# Patient Record
Sex: Female | Born: 1940 | Race: White | Hispanic: No | Smoking: Never smoker
Health system: Southern US, Community
[De-identification: ages and names within clinical notes are randomized; demographics above are authoritative.]

## PROBLEM LIST (undated history)

## (undated) DIAGNOSIS — F329 Major depressive disorder, single episode, unspecified: Secondary | ICD-10-CM

## (undated) DIAGNOSIS — M84453A Pathological fracture, unspecified femur, initial encounter for fracture: Secondary | ICD-10-CM

## (undated) DIAGNOSIS — F32A Depression, unspecified: Secondary | ICD-10-CM

## (undated) DIAGNOSIS — E039 Hypothyroidism, unspecified: Secondary | ICD-10-CM

## (undated) DIAGNOSIS — M94261 Chondromalacia, right knee: Secondary | ICD-10-CM

## (undated) DIAGNOSIS — S83249A Other tear of medial meniscus, current injury, unspecified knee, initial encounter: Secondary | ICD-10-CM

## (undated) DIAGNOSIS — J302 Other seasonal allergic rhinitis: Secondary | ICD-10-CM

## (undated) DIAGNOSIS — K219 Gastro-esophageal reflux disease without esophagitis: Secondary | ICD-10-CM

## (undated) DIAGNOSIS — E785 Hyperlipidemia, unspecified: Secondary | ICD-10-CM

## (undated) HISTORY — PX: CATARACT EXTRACTION W/ INTRAOCULAR LENS  IMPLANT, BILATERAL: SHX1307

## (undated) HISTORY — PX: APPENDECTOMY: SHX54

---

## 2011-02-08 DIAGNOSIS — R1310 Dysphagia, unspecified: Secondary | ICD-10-CM | POA: Insufficient documentation

## 2011-02-08 DIAGNOSIS — R140 Abdominal distension (gaseous): Secondary | ICD-10-CM | POA: Insufficient documentation

## 2011-02-08 DIAGNOSIS — R208 Other disturbances of skin sensation: Secondary | ICD-10-CM | POA: Insufficient documentation

## 2011-02-08 DIAGNOSIS — R079 Chest pain, unspecified: Secondary | ICD-10-CM | POA: Insufficient documentation

## 2011-02-08 DIAGNOSIS — R6881 Early satiety: Secondary | ICD-10-CM | POA: Insufficient documentation

## 2011-05-24 DIAGNOSIS — G521 Disorders of glossopharyngeal nerve: Secondary | ICD-10-CM | POA: Insufficient documentation

## 2015-10-17 DIAGNOSIS — M4727 Other spondylosis with radiculopathy, lumbosacral region: Secondary | ICD-10-CM | POA: Insufficient documentation

## 2015-10-17 DIAGNOSIS — M47817 Spondylosis without myelopathy or radiculopathy, lumbosacral region: Secondary | ICD-10-CM | POA: Insufficient documentation

## 2015-10-17 DIAGNOSIS — M159 Polyosteoarthritis, unspecified: Secondary | ICD-10-CM | POA: Insufficient documentation

## 2015-10-17 DIAGNOSIS — F4329 Adjustment disorder with other symptoms: Secondary | ICD-10-CM | POA: Insufficient documentation

## 2015-10-17 DIAGNOSIS — M47812 Spondylosis without myelopathy or radiculopathy, cervical region: Secondary | ICD-10-CM | POA: Insufficient documentation

## 2015-10-17 DIAGNOSIS — K219 Gastro-esophageal reflux disease without esophagitis: Secondary | ICD-10-CM | POA: Insufficient documentation

## 2015-10-17 DIAGNOSIS — E785 Hyperlipidemia, unspecified: Secondary | ICD-10-CM | POA: Insufficient documentation

## 2015-10-17 DIAGNOSIS — E039 Hypothyroidism, unspecified: Secondary | ICD-10-CM | POA: Insufficient documentation

## 2016-01-29 DIAGNOSIS — E663 Overweight: Secondary | ICD-10-CM | POA: Insufficient documentation

## 2016-12-07 ENCOUNTER — Ambulatory Visit (INDEPENDENT_AMBULATORY_CARE_PROVIDER_SITE_OTHER): Payer: Medicare Other | Admitting: Podiatry

## 2016-12-07 ENCOUNTER — Ambulatory Visit (HOSPITAL_BASED_OUTPATIENT_CLINIC_OR_DEPARTMENT_OTHER)
Admission: RE | Admit: 2016-12-07 | Discharge: 2016-12-07 | Disposition: A | Discharge status: Home / Self Care | Payer: Medicare Other | Source: Ambulatory Visit | Attending: Podiatry | Admitting: Podiatry

## 2016-12-07 ENCOUNTER — Encounter: Payer: Self-pay | Admitting: Podiatry

## 2016-12-07 DIAGNOSIS — M19079 Primary osteoarthritis, unspecified ankle and foot: Secondary | ICD-10-CM

## 2016-12-07 DIAGNOSIS — G629 Polyneuropathy, unspecified: Secondary | ICD-10-CM

## 2016-12-07 DIAGNOSIS — M79673 Pain in unspecified foot: Secondary | ICD-10-CM

## 2016-12-07 DIAGNOSIS — M19071 Primary osteoarthritis, right ankle and foot: Secondary | ICD-10-CM | POA: Diagnosis not present

## 2016-12-07 DIAGNOSIS — M7752 Other enthesopathy of left foot: Secondary | ICD-10-CM | POA: Insufficient documentation

## 2016-12-07 DIAGNOSIS — G8929 Other chronic pain: Secondary | ICD-10-CM | POA: Diagnosis not present

## 2016-12-07 MED ORDER — DICLOFENAC SODIUM 1 % TD GEL: 2.0000 g | Freq: Four times a day (QID) | TRANSDERMAL | 2 refills | Status: DC

## 2016-12-07 NOTE — Progress Notes (Signed)
   Subjective:    Patient ID: Monica Kerr, female    DOB: May 26, 1940, 76 y.o.   MRN: [id number]  HPI  76 year old female presents the office today with her son for concerns of chronic foot pain bilaterally with the right side worse than the left. She states that she has pain to the top of her right foot she points along the midfoot and after working all day she states that she has lateral pain and she has to squeeze her foot that makes the pain go away. She denies any recent injury or trauma. She continues to work standing several hours a day for the last 40 years. She denies any recent injury, she denies any swelling or redness. She does state that at the end of the day when she gets home she takes her shoes off she is quite a bit of pain in her feet to swell. She also describes a coldness, burning pain to her feet at times it feels like a bubble to her foot. This has been ongoing for some time. She is currently on nortriptyline for burning to her throat. She has no other concerns.  Review of Systems  All other systems reviewed and are negative.      Objective:   Physical Exam  General: AAO x3, NAD  Dermatological: Skin is warm, dry and supple bilateral. There are no open sores, no preulcerative lesions, no rash or signs of infection present.  Vascular: Dorsalis Pedis artery and Posterior Tibial artery pedal pulses are 2/4 bilateral with immedate capillary fill time.There is no pain with calf compression, swelling, warmth, erythema.   Neruologic: Grossly intact via light touch bilateral. Vibratory intact via tuning fork bilateral. Protective threshold with Semmes Wienstein monofilament intact to all pedal sites bilateral. She describes a burning, cold pain to her foot and she also describes a bubble sensation.  Musculoskeletal: On the left with is no area of tenderness identified this time there is no edema, erythema, increase in warmth. The right foot there is mild tenderness diffusely on  the midfoot on the Lisfranc joint. Again there is no overlying edema, erythema, increase in warmth. No specific area pinpoint bony tenderness and the pain appears to be diffuse on this area. MMT 5/5. Ankle, subtalar joint range of motion is intact  Gait: Unassisted, Nonantalgic.    Assessment: 76 year old female with bilateral foot pain likely result of osteoarthritis, concern for neuropathy given symptoms  Plan: -Treatment options discussed including all alternatives, risks, and complications -Etiology of symptoms were discussed -Ordered bilateral foot x-rays. -I discussed the change of shoe gear as well as inserts for her shoes. Power steps were dispensed. -Discussed with her compression stockings to the day as she is on her feet all day over this will help with some swelling and pain that she gets after he does not work as well. [facility] compression socks. -Also given her nerve symptoms I have recommended a nerve conduction test. She points on both legs symmetrically where she starts to feel the sensations in this is consistent with a neuropathy, nerve injury. -Prescribed Voltaren gel -Follow-up nerve conduction test or sooner if needed. Call any questions or concerns meantime.  Ovid CurdMatthew Wagoner, DPM

## 2016-12-08 ENCOUNTER — Telehealth: Payer: Self-pay | Admitting: *Deleted

## 2016-12-08 DIAGNOSIS — G629 Polyneuropathy, unspecified: Secondary | ICD-10-CM

## 2016-12-08 NOTE — Telephone Encounter (Signed)
Called patient and there was no answer and per Dr Andi DevonWagoner-patient needed to get a pair of compression stockings and I am going to go ahead and mail the form to the patient and call again to let patient know. Misty StanleyLisa

## 2016-12-08 NOTE — Telephone Encounter (Addendum)
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 12/08/2016-[facility]

## 2016-12-10 NOTE — Telephone Encounter (Addendum)
-----   Message from Vivi BarrackMatthew R Wagoner, DPM sent at 12/08/2016  2:16 PM EDT ----- Mild arthritis on the right foot; normal on the left- please let her know. 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 I informed pt of Dr. Gabriel RungWagoner's review of x-ray results. Pt states she has pain what is he going to do for that and she needs compression stockings and she has burning and stinging at night. I told pt that she would be getting [facility] and I gave her the [phone] to call for an appt, and she was to use the Voltaren Gel for the pain. I told her I would mail her a rx for the compression hose with the phone number to a supply store [place]. Mailed compression stockings 20-8830mmHg to the knee, with [facility], [phone], [place] [employer], with my card.

## 2016-12-30 ENCOUNTER — Telehealth: Payer: Self-pay

## 2016-12-30 ENCOUNTER — Telehealth: Payer: Self-pay | Admitting: Podiatry

## 2016-12-30 NOTE — Telephone Encounter (Signed)
Thanks for calling her. I was wanting to get the results of the NCV before follow-up but if there is any worsening or any other issues she is more than welcome to come anytime.

## 2016-12-30 NOTE — Telephone Encounter (Signed)
This is Alinda Money [place] calling for my mom. I brought my mother to her appointment on Tuesday 04 September with Dr. Ardelle Anton in [facility]. He diagnosed her as having arthritis and possibly nerve damage in her feet. We are disappointed in any follow up. Was wondering if someone could call my mother at work [phone] or her mobile [phone]. If someone can follow up, she already knows its arthritis so she can start treating that. Now she has to see a nerve doctor and has to make her own appointment which is disappointing because we thought doctors made the appointments and follow up. There has been little follow up from this office. If someone could please call my mother and talk about the arthritis, how we are going to treat it, and set up an appointment with the nerve doctor as she needs help with this because she is 76 years old. If you need to speak with me, you can reach me at [phone].

## 2016-12-30 NOTE — Telephone Encounter (Signed)
Called [facility], they informed me that they were a little behind on their referral work-que, stated that they had just called the patient 20 minutes ago and left a VM for her to return their call to schedule her appt. LVM with her son, Alinda Money. Attempted to call the patient but I was unable to leave a voice message.

## 2017-03-22 ENCOUNTER — Encounter: Payer: Self-pay | Admitting: Family Medicine

## 2017-03-22 ENCOUNTER — Ambulatory Visit (HOSPITAL_BASED_OUTPATIENT_CLINIC_OR_DEPARTMENT_OTHER)
Admission: RE | Admit: 2017-03-22 | Discharge: 2017-03-22 | Disposition: A | Discharge status: Home / Self Care | Payer: Medicare Other | Source: Ambulatory Visit | Attending: Family Medicine | Admitting: Family Medicine

## 2017-03-22 ENCOUNTER — Ambulatory Visit (INDEPENDENT_AMBULATORY_CARE_PROVIDER_SITE_OTHER): Payer: Medicare Other | Admitting: Family Medicine

## 2017-03-22 VITALS — BP 127/84 | HR 80 | Ht 60.0 in | Wt 165.0 lb

## 2017-03-22 DIAGNOSIS — M25561 Pain in right knee: Secondary | ICD-10-CM | POA: Diagnosis not present

## 2017-03-22 DIAGNOSIS — M79604 Pain in right leg: Secondary | ICD-10-CM | POA: Diagnosis present

## 2017-03-22 MED ORDER — METHYLPREDNISOLONE ACETATE 40 MG/ML IJ SUSP
40.0000 mg | Freq: Once | INTRAMUSCULAR | Status: AC
  Administered 2017-03-22: 40 mg via INTRA_ARTICULAR

## 2017-03-22 NOTE — Patient Instructions (Signed)
Your ultrasound was negative for a blood clot. Your pain is due to arthritis. These are the different medications you can take for this: Tylenol 500mg  1-2 tabs three times a day for pain. Capsaicin, aspercreme, or biofreeze topically up to four times a day may also help with pain. Some supplements that may help for arthritis: Boswellia extract, curcumin, pycnogenol Aleve 1-2 tabs twice a day with food for pain and inflammation Cortisone injections are an option - you were given this today. If cortisone injections do not help, there are different types of shots that may help but they take longer to take effect. It's important that you continue to stay active. Straight leg raises, knee extensions 3 sets of 10 once a day (add ankle weight if these become too easy). Consider physical therapy to strengthen muscles around the joint that hurts to take pressure off of the joint itself. Shoe inserts with good arch support may be helpful. Knee sleeve or brace when up and walking around for support. Heat or ice 15 minutes at a time 3-4 times a day as needed to help with pain. Water aerobics and cycling with low resistance are the best two types of exercise for arthritis though any exercise is ok as long as it doesn't worsen the pain. Follow up with me in 1 month but call me sooner if you're struggling.

## 2017-03-23 ENCOUNTER — Encounter: Payer: Self-pay | Admitting: Family Medicine

## 2017-03-23 DIAGNOSIS — M25561 Pain in right knee: Secondary | ICD-10-CM | POA: Insufficient documentation

## 2017-03-23 NOTE — Assessment & Plan Note (Signed)
doppler u/s negative for DVT.  Exam concerning for severe flare of medial arthritis.  Discussed tylenol, topical medications, supplements, aleve.  Cortisone injection given today.  Shown home exercises to do daily.  Ice/heat.  Knee sleeve or brace for support.  F/u in 1 month  After informed written consent timeout was performed, patient was seated on exam table. Right knee was prepped with alcohol swab and utilizing anteromedial approach, patient's right knee was injected intraarticularly with 3:1 bupivicaine: depomedrol. Patient tolerated the procedure well without immediate complications.

## 2017-03-23 NOTE — Progress Notes (Signed)
PCP: System, Pcp Not In  Subjective:   HPI: Patient is a 76 y.o. female here for right knee pain.  Patient reports about 4-5 days ago she started to get worsening pain medial right knee and calf. Pain level 9-10/10 now and sharp. Worse with walking. Tightens up at night. Worse also with prolonged standing, bending, stairs. Has been icing all day with some benefit. No skin changes, numbness, injuries.  History reviewed. No pertinent past medical history.  Current Outpatient Medications on File Prior to Visit  Medication Sig Dispense Refill  . citalopram (CELEXA) 20 MG tablet Take 20 mg by mouth.    . diclofenac sodium (VOLTAREN) 1 % GEL Apply 2 g topically 4 (four) times daily. Rub into affected area of foot 2 to 4 times daily 100 g 2  . levothyroxine (SYNTHROID, LEVOTHROID) 25 MCG tablet TAKE 1 TABLET BY MOUTH DAILY    . nortriptyline (PAMELOR) 50 MG capsule Take 50 mg by mouth.    . rosuvastatin (CRESTOR) 20 MG tablet TAKE 1 TABLET BY MOUTH DAILY. GENERIC FOR CRESTOR    . zolpidem (AMBIEN) 10 MG tablet TAKE 1 TABLET BY MOUTH EVERY NIGHT AT BEDTIME AS NEEDED FOR SLEEP     No current facility-administered medications on file prior to visit.     History reviewed. No pertinent surgical history.  No Known Allergies  Social History   Socioeconomic History  . Marital status: Legally Separated    Spouse name: Not on file  . Number of children: Not on file  . Years of education: Not on file  . Highest education level: Not on file  Social Needs  . Financial resource strain: Not on file  . Food insecurity - worry: Not on file  . Food insecurity - inability: Not on file  . Transportation needs - medical: Not on file  . Transportation needs - non-medical: Not on file  Occupational History  . Not on file  Tobacco Use  . Smoking status: Unknown If Ever Smoked  . Smokeless tobacco: Never Used  Substance and Sexual Activity  . Alcohol use: Not on file  . Drug use: Not on file   . Sexual activity: Not on file  Other Topics Concern  . Not on file  Social History Narrative  . Not on file    History reviewed. No pertinent family history.  BP 127/84   Pulse 80   Ht 5' (1.524 m)   Wt 165 lb (74.8 kg)   BMI 32.22 kg/m   Review of Systems: See HPI above.     Objective:  Physical Exam:  Gen: NAD, comfortable in exam room  Right knee: No gross deformity, ecchymoses, effusion, palpable cords. TTP medial joint line and medial gastroc.  No other tenderness. FROM with 5/5 strength. Negative ant/post drawers. Negative valgus/varus testing. Negative lachmanns. Negative mcmurrays, apleys, patellar apprehension. NV intact distally.  Left knee: No gross deformity, ecchymoses, effusion. No TTP. FROM with 5/5 strength. Negative ant/post drawers. Negative valgus/varus testing. NV intact distally.   Assessment & Plan:  1. Right knee pain - doppler u/s negative for DVT.  Exam concerning for severe flare of medial arthritis.  Discussed tylenol, topical medications, supplements, aleve.  Cortisone injection given today.  Shown home exercises to do daily.  Ice/heat.  Knee sleeve or brace for support.  F/u in 1 month  After informed written consent timeout was performed, patient was seated on exam table. Right knee was prepped with alcohol swab and utilizing anteromedial approach, patient's  right knee was injected intraarticularly with 3:1 bupivicaine: depomedrol. Patient tolerated the procedure well without immediate complications.

## 2017-04-15 DIAGNOSIS — F325 Major depressive disorder, single episode, in full remission: Secondary | ICD-10-CM | POA: Insufficient documentation

## 2017-04-19 ENCOUNTER — Encounter: Payer: Self-pay | Admitting: Family Medicine

## 2017-04-19 ENCOUNTER — Ambulatory Visit (INDEPENDENT_AMBULATORY_CARE_PROVIDER_SITE_OTHER): Payer: Medicare Other | Admitting: Family Medicine

## 2017-04-19 ENCOUNTER — Ambulatory Visit (HOSPITAL_BASED_OUTPATIENT_CLINIC_OR_DEPARTMENT_OTHER)
Admission: RE | Admit: 2017-04-19 | Discharge: 2017-04-19 | Disposition: A | Discharge status: Home / Self Care | Payer: Medicare Other | Source: Ambulatory Visit | Attending: Family Medicine | Admitting: Family Medicine

## 2017-04-19 VITALS — BP 161/95 | HR 80 | Ht 60.0 in | Wt 176.0 lb

## 2017-04-19 DIAGNOSIS — M25561 Pain in right knee: Secondary | ICD-10-CM | POA: Diagnosis present

## 2017-04-19 NOTE — Patient Instructions (Signed)
The arthritis of your knee is mild and I would expect you to have much more improvement with the shot if this was causing your pain. We will go ahead with an MRI to assess for a medial meniscus tear - I will call you with the results and next steps or you can make a no-charge follow up visit with me after this and I can go over them with you in person.

## 2017-04-20 ENCOUNTER — Encounter: Payer: Self-pay | Admitting: Family Medicine

## 2017-04-20 NOTE — Assessment & Plan Note (Signed)
doppler was negative for DVT.  Independently reviewed radiographs and mild arthritis noted - would expect if this was causing her pain she would have gotten more relief with the injection which was not the case.  Medial meniscus tear is a possibility as well though exam is reassuring.  We discussed physical therapy vs MRI.  Concern she would not be able to tolerate physical therapy with level of her pain.  Decided to go ahead with MRI of the knee to further assess.

## 2017-04-20 NOTE — Progress Notes (Addendum)
PCP: System, Pcp Not In  Subjective:   HPI: Patient is a 77 y.o. female here for right knee pain.  12/18: Patient reports about 4-5 days ago she started to get worsening pain medial right knee and calf. Pain level 9-10/10 now and sharp. Worse with walking. Tightens up at night. Worse also with prolonged standing, bending, stairs. Has been icing all day with some benefit. No skin changes, numbness, injuries.  04/19/17: Patient reports she's still having pain and difficulty walking due to medial right knee into leg pain. Pain level 10/10 and sharp, worse with bearing weight. Improves with brace. Feels unstable and heavy. No benefit with injection unfortunately. No skin changes, numbness.  History reviewed. No pertinent past medical history.  Current Outpatient Medications on File Prior to Visit  Medication Sig Dispense Refill  . citalopram (CELEXA) 20 MG tablet Take 20 mg by mouth.    . diclofenac sodium (VOLTAREN) 1 % GEL Apply 2 g topically 4 (four) times daily. Rub into affected area of foot 2 to 4 times daily 100 g 2  . levothyroxine (SYNTHROID, LEVOTHROID) 25 MCG tablet TAKE 1 TABLET BY MOUTH DAILY    . nortriptyline (PAMELOR) 50 MG capsule Take 50 mg by mouth.    . rosuvastatin (CRESTOR) 20 MG tablet TAKE 1 TABLET BY MOUTH DAILY. GENERIC FOR CRESTOR    . zolpidem (AMBIEN) 10 MG tablet TAKE 1 TABLET BY MOUTH EVERY NIGHT AT BEDTIME AS NEEDED FOR SLEEP     No current facility-administered medications on file prior to visit.     History reviewed. No pertinent surgical history.  No Known Allergies  Social History   Socioeconomic History  . Marital status: Legally Separated    Spouse name: Not on file  . Number of children: Not on file  . Years of education: Not on file  . Highest education level: Not on file  Social Needs  . Financial resource strain: Not on file  . Food insecurity - worry: Not on file  . Food insecurity - inability: Not on file  . Transportation  needs - medical: Not on file  . Transportation needs - non-medical: Not on file  Occupational History  . Not on file  Tobacco Use  . Smoking status: Unknown If Ever Smoked  . Smokeless tobacco: Never Used  Substance and Sexual Activity  . Alcohol use: Not on file  . Drug use: Not on file  . Sexual activity: Not on file  Other Topics Concern  . Not on file  Social History Narrative  . Not on file    History reviewed. No pertinent family history.  BP (!) 161/95   Pulse 80   Ht 5' (1.524 m)   Wt 176 lb (79.8 kg)   BMI 34.37 kg/m   Review of Systems: See HPI above.     Objective:  Physical Exam:  Gen: NAD, comfortable in exam room.  Right knee: No gross deformity, ecchymoses, effusion. TTP medial joint line and pes, less medial gastroc.  No other tenderness. FROM with 5/5 strength. Negative ant/post drawers. Negative valgus/varus testing. Negative lachmanns. Negative mcmurrays, apleys, patellar apprehension. NV intact distally.   Assessment & Plan:  1. Right knee pain - doppler was negative for DVT.  Independently reviewed radiographs and mild arthritis noted - would expect if this was causing her pain she would have gotten more relief with the injection which was not the case.  Medial meniscus tear is a possibility as well though exam is reassuring.  We discussed physical therapy vs MRI.  Concern she would not be able to tolerate physical therapy with level of her pain.  Decided to go ahead with MRI of the knee to further assess.  Addendum:  MRI report reviewed and discussed with patient's [place]inda [place] ([place]  She has a medial femoral condyle insufficiency fracture but this is nondisplaced with marrow edema - I suspect this is the main cause of her pain.  She does have a radial tear of posterior medial meniscus and ruptured bakers cyst.  Advised I would recommend she not bear weight on right leg to allow fracture to heal over 1 month to 6 weeks, consider knee scooter or  wheelchair during that time.  They are going to drop of CD of images for me to review as well.  Would plan to see her back in 1 month.

## 2017-05-04 ENCOUNTER — Encounter: Payer: Self-pay | Admitting: Family Medicine

## 2017-05-23 ENCOUNTER — Telehealth: Payer: Self-pay | Admitting: *Deleted

## 2017-05-23 NOTE — Telephone Encounter (Signed)
I really need her to rest her knee and not put weight on this to allow the fracture to heal. I would recommend she not restart at work yet and we reevaluate as scheduled on 3/8.

## 2017-05-23 NOTE — Telephone Encounter (Signed)
Patient called and wanted to know if she could go back to work for 2 hours a day. Patient stated that she would be sitting.

## 2017-05-23 NOTE — Telephone Encounter (Signed)
Spoke to patient and gave her information provided by physician. 

## 2017-06-10 ENCOUNTER — Encounter: Payer: Self-pay | Admitting: Family Medicine

## 2017-06-10 ENCOUNTER — Ambulatory Visit (HOSPITAL_BASED_OUTPATIENT_CLINIC_OR_DEPARTMENT_OTHER)
Admission: RE | Admit: 2017-06-10 | Discharge: 2017-06-10 | Disposition: A | Discharge status: Home / Self Care | Payer: Medicare Other | Source: Ambulatory Visit | Attending: Family Medicine | Admitting: Family Medicine

## 2017-06-10 ENCOUNTER — Ambulatory Visit (INDEPENDENT_AMBULATORY_CARE_PROVIDER_SITE_OTHER): Payer: Medicare Other | Admitting: Family Medicine

## 2017-06-10 VITALS — BP 142/70 | Ht 60.0 in | Wt 176.0 lb

## 2017-06-10 DIAGNOSIS — G8929 Other chronic pain: Secondary | ICD-10-CM

## 2017-06-10 DIAGNOSIS — M25561 Pain in right knee: Secondary | ICD-10-CM | POA: Diagnosis present

## 2017-06-10 MED ORDER — MELOXICAM 15 MG PO TABS: 15.0000 mg | ORAL_TABLET | Freq: Every day | ORAL | 2 refills | Status: DC

## 2017-06-10 MED FILL — MELOXICAM 15 MG TABLET: 15 | 30 days supply | Qty: 30 | Fill #0

## 2017-06-10 NOTE — Patient Instructions (Signed)
Your x-rays look great. At this point I believe the fracture has healed.  Given you're still having a lot of pain and your meniscus testing is still positive, it's suggestive the meniscus tear is symptomatic and will need to be address with surgery. We will refer you to an orthopedic surgeon to discuss the procedure and for you to schedule this.

## 2017-06-10 NOTE — Progress Notes (Signed)
PCP: System, Pcp Not In  Subjective:   HPI: Patient is a 77 y.o. female here for right knee pain.  12/18: Patient reports about 4-5 days ago she started to get worsening pain medial right knee and calf. Pain level 9-10/10 now and sharp. Worse with walking. Tightens up at night. Worse also with prolonged standing, bending, stairs. Has been icing all day with some benefit. No skin changes, numbness, injuries.  04/19/17: Patient reports she's still having pain and difficulty walking due to medial right knee into leg pain. Pain level 10/10 and sharp, worse with bearing weight. Improves with brace. Feels unstable and heavy. No benefit with injection unfortunately. No skin changes, numbness.  3/8: Patient reports she still has fairly severe sharp pain at 9/10 level anteromedial right knee. She has been using crutches, taking ibuprofen with only mild benefit. No new injuries. No skin changes, numbness.  No past medical history on file.  Current Outpatient Medications on File Prior to Visit  Medication Sig Dispense Refill  . citalopram (CELEXA) 20 MG tablet Take 20 mg by mouth.    . diclofenac sodium (VOLTAREN) 1 % GEL Apply 2 g topically 4 (four) times daily. Rub into affected area of foot 2 to 4 times daily 100 g 2  . levothyroxine (SYNTHROID, LEVOTHROID) 25 MCG tablet TAKE 1 TABLET BY MOUTH DAILY    . nortriptyline (PAMELOR) 50 MG capsule Take 50 mg by mouth.    . rosuvastatin (CRESTOR) 20 MG tablet TAKE 1 TABLET BY MOUTH DAILY. GENERIC FOR CRESTOR    . zolpidem (AMBIEN) 10 MG tablet TAKE 1 TABLET BY MOUTH EVERY NIGHT AT BEDTIME AS NEEDED FOR SLEEP     No current facility-administered medications on file prior to visit.     No past surgical history on file.  No Known Allergies  Social History   Socioeconomic History  . Marital status: Legally Separated    Spouse name: Not on file  . Number of children: Not on file  . Years of education: Not on file  . Highest  education level: Not on file  Social Needs  . Financial resource strain: Not on file  . Food insecurity - worry: Not on file  . Food insecurity - inability: Not on file  . Transportation needs - medical: Not on file  . Transportation needs - non-medical: Not on file  Occupational History  . Not on file  Tobacco Use  . Smoking status: Unknown If Ever Smoked  . Smokeless tobacco: Never Used  Substance and Sexual Activity  . Alcohol use: Not on file  . Drug use: Not on file  . Sexual activity: Not on file  Other Topics Concern  . Not on file  Social History Narrative  . Not on file    No family history on file.  BP (!) 142/70   Ht 5' (1.524 m)   Wt 176 lb (79.8 kg)   BMI 34.37 kg/m   Review of Systems: See HPI above.     Objective:  Physical Exam:  Gen: NAD, comfortable in exam room.  Right knee: No gross deformity, ecchymoses, effusion. TTP medial joint line.  No other tenderness. FROM with 5/5 strength flexion and extension. Negative ant/post drawers. Negative valgus/varus testing. Negative lachmanns. Positive mcmurrays, apleys, negative patellar apprehension. NV intact distally.   Assessment & Plan:  1. Right knee pain - Independently reviewed repeat radiographs today and no evidence collapse of femoral condyle.  Tenderness is far medial with positive meniscus testing today.  At this point clinically pain is from her medial meniscus tear and not her fracture with edema which has healed.  Not improving with conservative treatment - recommended ortho eval to discuss arthroscopic debridement of tear.  Meloxicam, tylenol if needed.

## 2017-06-10 NOTE — Assessment & Plan Note (Signed)
Independently reviewed repeat radiographs today and no evidence collapse of femoral condyle.  Tenderness is far medial with positive meniscus testing today.  At this point clinically pain is from her medial meniscus tear and not her fracture with edema which has healed.  Not improving with conservative treatment - recommended ortho eval to discuss arthroscopic debridement of tear.  Meloxicam, tylenol if needed.

## 2017-06-23 NOTE — H&P (Signed)
[facility] [address]   [address] [place] [id number] [phone] A Division [facility]  RE:   Monica Kerr SinningCAPRA, Monica Kerr             [id number]      DOB:  March 18, 1941 INITIAL EVALUATION 06-17-2017  Reason for visit:  The patient is a referral from Dr. Pearletha ForgeHudnall for right knee.  History of present illness: The patient is 77 years old and developed pain over Christmas, December 2018, when she had popping and pain on her medial knee.  She was to the point when she was having to use crutches and a cane to get around.  X-rays demonstrated really minimal narrowing.  She had an MRI, which demonstrated a stress fracture to her medial femoral condyle, some cartilage loss and a posteromedial complex meniscus tear.  She had an injection which did not provide any relief.  EXAMINATION: Well-appearing female in no apparent distress.  The right lower extremity shows tenderness over her medial femoral condyle as well as some joint line pain, a positive flexion pinch/McMurray's.   She is neurovascularly intact with a stable ligament exam.  IMAGES: X-rays reviewed by me:   MRI results are noted above; medial femoral condyle stress fracture, complex medial meniscus tear and some chondromalacia.  ASSESSMENT & PLAN: We had a long talk about her options.  She may be going down the path of needing a unicompartmental arthroplasty.  For now she is interested in undergoing a medial sided partial meniscectomy, chondroplasty and percutaneous ORIF of her medial femoral condyle with a subchondroplasty technique.  We will set this up when available.   Jewel Baizeimothy D.  Eulah PontMurphy, M.D.   Electronically verified by Jewel Baizeimothy D. Eulah PontMurphy, M.D. TDM:rc Cc:  Monica BlizzardShane Hudnall MD  fax [phone]  Cc:  Burnett KanarisVirginia Fulbright PA-C  fax [phone] D 06-17-2017 T 06-21-2017

## 2017-07-04 DIAGNOSIS — M84453A Pathological fracture, unspecified femur, initial encounter for fracture: Secondary | ICD-10-CM

## 2017-07-04 DIAGNOSIS — S83249A Other tear of medial meniscus, current injury, unspecified knee, initial encounter: Secondary | ICD-10-CM

## 2017-07-04 DIAGNOSIS — M94261 Chondromalacia, right knee: Secondary | ICD-10-CM

## 2017-07-04 HISTORY — DX: Pathological fracture, unspecified femur, initial encounter for fracture: M84.453A

## 2017-07-04 HISTORY — DX: Chondromalacia, right knee: M94.261

## 2017-07-04 HISTORY — DX: Other tear of medial meniscus, current injury, unspecified knee, initial encounter: S83.249A

## 2017-07-14 ENCOUNTER — Encounter (HOSPITAL_BASED_OUTPATIENT_CLINIC_OR_DEPARTMENT_OTHER): Payer: Self-pay | Admitting: *Deleted

## 2017-07-14 ENCOUNTER — Other Ambulatory Visit: Payer: Self-pay

## 2017-07-19 ENCOUNTER — Ambulatory Visit (HOSPITAL_BASED_OUTPATIENT_CLINIC_OR_DEPARTMENT_OTHER): Payer: Medicare Other | Admitting: Certified Registered Nurse Anesthetist

## 2017-07-19 ENCOUNTER — Other Ambulatory Visit: Payer: Self-pay

## 2017-07-19 ENCOUNTER — Encounter (HOSPITAL_BASED_OUTPATIENT_CLINIC_OR_DEPARTMENT_OTHER)
Admission: RE | Disposition: A | Discharge status: Home / Self Care | Payer: Self-pay | Source: Ambulatory Visit | Attending: Orthopedic Surgery

## 2017-07-19 ENCOUNTER — Ambulatory Visit (HOSPITAL_BASED_OUTPATIENT_CLINIC_OR_DEPARTMENT_OTHER)
Admission: RE | Admit: 2017-07-19 | Discharge: 2017-07-19 | Disposition: A | Discharge status: Home / Self Care | Payer: Medicare Other | Source: Ambulatory Visit | Attending: Orthopedic Surgery | Admitting: Orthopedic Surgery

## 2017-07-19 ENCOUNTER — Encounter (HOSPITAL_BASED_OUTPATIENT_CLINIC_OR_DEPARTMENT_OTHER): Payer: Self-pay

## 2017-07-19 DIAGNOSIS — M25561 Pain in right knee: Secondary | ICD-10-CM | POA: Diagnosis not present

## 2017-07-19 DIAGNOSIS — M84451A Pathological fracture, right femur, initial encounter for fracture: Secondary | ICD-10-CM | POA: Insufficient documentation

## 2017-07-19 DIAGNOSIS — M2241 Chondromalacia patellae, right knee: Secondary | ICD-10-CM | POA: Diagnosis not present

## 2017-07-19 DIAGNOSIS — X58XXXA Exposure to other specified factors, initial encounter: Secondary | ICD-10-CM | POA: Diagnosis not present

## 2017-07-19 DIAGNOSIS — S83231A Complex tear of medial meniscus, current injury, right knee, initial encounter: Secondary | ICD-10-CM | POA: Insufficient documentation

## 2017-07-19 DIAGNOSIS — S83206A Unspecified tear of unspecified meniscus, current injury, right knee, initial encounter: Secondary | ICD-10-CM

## 2017-07-19 HISTORY — DX: Gastro-esophageal reflux disease without esophagitis: K21.9

## 2017-07-19 HISTORY — DX: Other seasonal allergic rhinitis: J30.2

## 2017-07-19 HISTORY — DX: Depression, unspecified: F32.A

## 2017-07-19 HISTORY — PX: KNEE ARTHROSCOPY WITH MEDIAL MENISECTOMY: SHX5651

## 2017-07-19 HISTORY — DX: Pathological fracture, unspecified femur, initial encounter for fracture: M84.453A

## 2017-07-19 HISTORY — DX: Hypothyroidism, unspecified: E03.9

## 2017-07-19 HISTORY — DX: Chondromalacia, right knee: M94.261

## 2017-07-19 HISTORY — PX: PERCUTANEOUS PINNING: SHX2209

## 2017-07-19 HISTORY — DX: Major depressive disorder, single episode, unspecified: F32.9

## 2017-07-19 HISTORY — DX: Hyperlipidemia, unspecified: E78.5

## 2017-07-19 HISTORY — DX: Other tear of medial meniscus, current injury, unspecified knee, initial encounter: S83.249A

## 2017-07-19 SURGERY — ARTHROSCOPY, KNEE, WITH MEDIAL MENISCECTOMY
Anesthesia: General | Site: Knee | Laterality: Right

## 2017-07-19 MED ORDER — BUPIVACAINE HCL (PF) 0.25 % IJ SOLN
INTRAMUSCULAR | Status: DC | PRN
  Administered 2017-07-19: 10 mL

## 2017-07-19 MED ORDER — PROPOFOL 10 MG/ML IV BOLUS
INTRAVENOUS | Status: AC
  Filled 2017-07-19: qty 20

## 2017-07-19 MED ORDER — LACTATED RINGERS IV SOLN: INTRAVENOUS | Status: DC

## 2017-07-19 MED ORDER — MIDAZOLAM HCL 2 MG/2ML IJ SOLN
INTRAMUSCULAR | Status: AC
  Filled 2017-07-19: qty 2

## 2017-07-19 MED ORDER — FENTANYL CITRATE (PF) 100 MCG/2ML IJ SOLN
INTRAMUSCULAR | Status: AC
  Filled 2017-07-19: qty 2

## 2017-07-19 MED ORDER — CELECOXIB 200 MG PO CAPS: 200.0000 mg | ORAL_CAPSULE | Freq: Two times a day (BID) | ORAL | 0 refills | Status: AC

## 2017-07-19 MED ORDER — LIDOCAINE HCL (CARDIAC) 20 MG/ML IV SOLN
INTRAVENOUS | Status: AC
  Filled 2017-07-19: qty 5

## 2017-07-19 MED ORDER — BUPIVACAINE-EPINEPHRINE (PF) 0.5% -1:200000 IJ SOLN
INTRAMUSCULAR | Status: DC | PRN
  Administered 2017-07-19: 25 mL via PERINEURAL

## 2017-07-19 MED ORDER — PANTOPRAZOLE SODIUM 40 MG PO TBEC: 40.0000 mg | DELAYED_RELEASE_TABLET | Freq: Every day | ORAL | 0 refills | Status: AC

## 2017-07-19 MED ORDER — DEXAMETHASONE SODIUM PHOSPHATE 10 MG/ML IJ SOLN
INTRAMUSCULAR | Status: DC | PRN
  Administered 2017-07-19: 10 mg via INTRAVENOUS

## 2017-07-19 MED ORDER — ASPIRIN EC 81 MG PO TBEC: 81.0000 mg | DELAYED_RELEASE_TABLET | Freq: Every day | ORAL | 0 refills | Status: AC

## 2017-07-19 MED ORDER — SODIUM CHLORIDE 0.9 % IR SOLN
Status: DC | PRN
  Administered 2017-07-19: 3000 mL

## 2017-07-19 MED ORDER — OXYCODONE HCL 5 MG PO TABS: 5.0000 mg | ORAL_TABLET | ORAL | 0 refills | Status: AC | PRN

## 2017-07-19 MED ORDER — CEFAZOLIN SODIUM-DEXTROSE 2-4 GM/100ML-% IV SOLN: 2.0000 g | INTRAVENOUS | Status: DC

## 2017-07-19 MED ORDER — FENTANYL CITRATE (PF) 100 MCG/2ML IJ SOLN
50.0000 ug | INTRAMUSCULAR | Status: DC | PRN
  Administered 2017-07-19: 50 ug via INTRAVENOUS

## 2017-07-19 MED ORDER — ACETAMINOPHEN 500 MG PO TABS
ORAL_TABLET | ORAL | Status: AC
  Filled 2017-07-19: qty 2

## 2017-07-19 MED ORDER — CEFAZOLIN SODIUM-DEXTROSE 2-4 GM/100ML-% IV SOLN
INTRAVENOUS | Status: AC
  Filled 2017-07-19: qty 100

## 2017-07-19 MED ORDER — PROPOFOL 10 MG/ML IV BOLUS
INTRAVENOUS | Status: DC | PRN
  Administered 2017-07-19: 150 mg via INTRAVENOUS

## 2017-07-19 MED ORDER — BUPIVACAINE HCL (PF) 0.25 % IJ SOLN
INTRAMUSCULAR | Status: AC
  Filled 2017-07-19: qty 30

## 2017-07-19 MED ORDER — CLONIDINE HCL (ANALGESIA) 100 MCG/ML EP SOLN
EPIDURAL | Status: DC | PRN
  Administered 2017-07-19: 50 ug

## 2017-07-19 MED ORDER — KETOROLAC TROMETHAMINE 30 MG/ML IJ SOLN
15.0000 mg | Freq: Once | INTRAMUSCULAR | Status: AC | PRN
  Administered 2017-07-19: 15 mg via INTRAVENOUS

## 2017-07-19 MED ORDER — DOCUSATE SODIUM 100 MG PO CAPS: 100.0000 mg | ORAL_CAPSULE | Freq: Two times a day (BID) | ORAL | 0 refills | Status: AC

## 2017-07-19 MED ORDER — MEPERIDINE HCL 25 MG/ML IJ SOLN: 6.2500 mg | INTRAMUSCULAR | Status: DC | PRN

## 2017-07-19 MED ORDER — ONDANSETRON HCL 4 MG/2ML IJ SOLN
INTRAMUSCULAR | Status: DC | PRN
  Administered 2017-07-19: 4 mg via INTRAVENOUS

## 2017-07-19 MED ORDER — CHLORHEXIDINE GLUCONATE 4 % EX LIQD: 60.0000 mL | Freq: Once | CUTANEOUS | Status: DC

## 2017-07-19 MED ORDER — BACLOFEN 10 MG PO TABS: 10.0000 mg | ORAL_TABLET | Freq: Three times a day (TID) | ORAL | 0 refills | Status: AC | PRN

## 2017-07-19 MED ORDER — PROMETHAZINE HCL 25 MG/ML IJ SOLN: 6.2500 mg | INTRAMUSCULAR | Status: DC | PRN

## 2017-07-19 MED ORDER — ACETAMINOPHEN 500 MG PO TABS: 1000.0000 mg | ORAL_TABLET | Freq: Once | ORAL | Status: DC

## 2017-07-19 MED ORDER — MIDAZOLAM HCL 2 MG/2ML IJ SOLN: 1.0000 mg | INTRAMUSCULAR | Status: DC | PRN

## 2017-07-19 MED ORDER — ONDANSETRON HCL 4 MG PO TABS: 4.0000 mg | ORAL_TABLET | Freq: Three times a day (TID) | ORAL | 0 refills | Status: AC | PRN

## 2017-07-19 MED ORDER — FENTANYL CITRATE (PF) 100 MCG/2ML IJ SOLN
25.0000 ug | INTRAMUSCULAR | Status: DC | PRN
  Administered 2017-07-19 (×2): 25 ug via INTRAVENOUS

## 2017-07-19 MED ORDER — CEFAZOLIN SODIUM-DEXTROSE 2-4 GM/100ML-% IV SOLN
2.0000 g | INTRAVENOUS | Status: AC
  Administered 2017-07-19: 2 g via INTRAVENOUS

## 2017-07-19 MED ORDER — DEXAMETHASONE SODIUM PHOSPHATE 10 MG/ML IJ SOLN
INTRAMUSCULAR | Status: AC
Start: 2017-07-19 — End: ?
  Filled 2017-07-19: qty 1

## 2017-07-19 MED ORDER — OXYCODONE HCL 5 MG PO TABS
ORAL_TABLET | ORAL | Status: AC
  Filled 2017-07-19: qty 1

## 2017-07-19 MED ORDER — OXYCODONE HCL 5 MG PO TABS
5.0000 mg | ORAL_TABLET | Freq: Once | ORAL | Status: AC | PRN
  Administered 2017-07-19: 5 mg via ORAL

## 2017-07-19 MED ORDER — METHYLPREDNISOLONE ACETATE 80 MG/ML IJ SUSP
INTRAMUSCULAR | Status: AC
  Filled 2017-07-19: qty 1

## 2017-07-19 MED ORDER — ACETAMINOPHEN 500 MG PO TABS: 1000.0000 mg | ORAL_TABLET | Freq: Three times a day (TID) | ORAL | 0 refills | Status: AC

## 2017-07-19 MED ORDER — FENTANYL CITRATE (PF) 100 MCG/2ML IJ SOLN
INTRAMUSCULAR | Status: DC | PRN
  Administered 2017-07-19 (×3): 25 ug via INTRAVENOUS

## 2017-07-19 MED ORDER — LIDOCAINE HCL (CARDIAC) 20 MG/ML IV SOLN
INTRAVENOUS | Status: DC | PRN
  Administered 2017-07-19: 40 mg via INTRAVENOUS

## 2017-07-19 MED ORDER — KETOROLAC TROMETHAMINE 30 MG/ML IJ SOLN
INTRAMUSCULAR | Status: AC
  Filled 2017-07-19: qty 1

## 2017-07-19 MED ORDER — SCOPOLAMINE 1 MG/3DAYS TD PT72: 1.0000 | MEDICATED_PATCH | Freq: Once | TRANSDERMAL | Status: DC | PRN

## 2017-07-19 MED ORDER — LACTATED RINGERS IV SOLN
INTRAVENOUS | Status: DC
  Administered 2017-07-19: 10:00:00 via INTRAVENOUS

## 2017-07-19 MED ORDER — ACETAMINOPHEN 500 MG PO TABS
1000.0000 mg | ORAL_TABLET | Freq: Once | ORAL | Status: AC
  Administered 2017-07-19: 1000 mg via ORAL

## 2017-07-19 MED ORDER — OXYCODONE HCL 5 MG/5ML PO SOLN: 5.0000 mg | Freq: Once | ORAL | Status: AC | PRN

## 2017-07-19 MED ORDER — ONDANSETRON HCL 4 MG/2ML IJ SOLN
INTRAMUSCULAR | Status: AC
  Filled 2017-07-19: qty 2

## 2017-07-19 SURGICAL SUPPLY — 78 items
BANDAGE ACE 4X5 VEL STRL LF (GAUZE/BANDAGES/DRESSINGS) ×3 IMPLANT
BANDAGE ACE 6X5 VEL STRL LF (GAUZE/BANDAGES/DRESSINGS) ×3 IMPLANT
BLADE CUDA 5.5 (BLADE) IMPLANT
BLADE CUDA GRT WHITE 3.5 (BLADE) ×3 IMPLANT
BLADE CUTTER GATOR 3.5 (BLADE) ×3 IMPLANT
BLADE CUTTER MENIS 5.5 (BLADE) IMPLANT
BLADE SURG 15 STRL LF DISP TIS (BLADE) ×1 IMPLANT
BLADE SURG 15 STRL SS (BLADE) ×2
BNDG COHESIVE 4X5 TAN STRL (GAUZE/BANDAGES/DRESSINGS) ×3 IMPLANT
BNDG ESMARK 4X9 LF (GAUZE/BANDAGES/DRESSINGS) ×3 IMPLANT
CHLORAPREP W/TINT 26ML (MISCELLANEOUS) ×3 IMPLANT
CLOSURE STERI-STRIP 1/2X4 (GAUZE/BANDAGES/DRESSINGS) ×1
CLSR STERI-STRIP ANTIMIC 1/2X4 (GAUZE/BANDAGES/DRESSINGS) ×2 IMPLANT
COVER BACK TABLE 60X90IN (DRAPES) ×3 IMPLANT
CUFF TOURNIQUET SINGLE 18IN (TOURNIQUET CUFF) IMPLANT
CUFF TOURNIQUET SINGLE 24IN (TOURNIQUET CUFF) IMPLANT
CUFF TOURNIQUET SINGLE 34IN LL (TOURNIQUET CUFF) ×3 IMPLANT
CUTTER MENISCUS  4.2MM (BLADE)
CUTTER MENISCUS 4.2MM (BLADE) IMPLANT
DECANTER SPIKE VIAL GLASS SM (MISCELLANEOUS) IMPLANT
DRAPE ARTHROSCOPY W/POUCH 90 (DRAPES) ×3 IMPLANT
DRAPE EXTREMITY T 121X128X90 (DRAPE) ×3 IMPLANT
DRAPE IMP U-DRAPE 54X76 (DRAPES) ×3 IMPLANT
DRAPE OEC MINIVIEW 54X84 (DRAPES) ×3 IMPLANT
DRAPE SURG 17X23 STRL (DRAPES) IMPLANT
DRAPE U-SHAPE 47X51 STRL (DRAPES) ×3 IMPLANT
DRSG EMULSION OIL 3X3 NADH (GAUZE/BANDAGES/DRESSINGS) ×3 IMPLANT
ELECT REM PT RETURN 9FT ADLT (ELECTROSURGICAL) ×3
ELECTRODE REM PT RTRN 9FT ADLT (ELECTROSURGICAL) ×1 IMPLANT
GAUZE SPONGE 4X4 12PLY STRL (GAUZE/BANDAGES/DRESSINGS) ×3 IMPLANT
GAUZE XEROFORM 1X8 LF (GAUZE/BANDAGES/DRESSINGS) IMPLANT
GLOVE BIO SURGEON STRL SZ7.5 (GLOVE) ×6 IMPLANT
GLOVE BIOGEL PI IND STRL 8 (GLOVE) ×2 IMPLANT
GLOVE BIOGEL PI IND STRL 8.5 (GLOVE) IMPLANT
GLOVE BIOGEL PI INDICATOR 8 (GLOVE) ×4
GLOVE BIOGEL PI INDICATOR 8.5 (GLOVE)
GOWN STRL REUS W/ TWL LRG LVL3 (GOWN DISPOSABLE) ×4 IMPLANT
GOWN STRL REUS W/ TWL XL LVL3 (GOWN DISPOSABLE) ×1 IMPLANT
GOWN STRL REUS W/TWL LRG LVL3 (GOWN DISPOSABLE) ×8
GOWN STRL REUS W/TWL XL LVL3 (GOWN DISPOSABLE) ×2
KIT MIXER ACCUMIX (KITS) ×3 IMPLANT
KNEE KIT SCP W/SIDE ACCUPORT (Joint) ×3 IMPLANT
KNEE WRAP E Z 3 GEL PACK (MISCELLANEOUS) ×3 IMPLANT
MANIFOLD NEPTUNE II (INSTRUMENTS) ×3 IMPLANT
NEEDLE HYPO 25X1 1.5 SAFETY (NEEDLE) ×3 IMPLANT
NS IRRIG 1000ML POUR BTL (IV SOLUTION) ×3 IMPLANT
PACK ARTHROSCOPY DSU (CUSTOM PROCEDURE TRAY) ×3 IMPLANT
PACK BASIN DAY SURGERY FS (CUSTOM PROCEDURE TRAY) ×3 IMPLANT
PAD CAST 4YDX4 CTTN HI CHSV (CAST SUPPLIES) ×1 IMPLANT
PADDING CAST ABS 4INX4YD NS (CAST SUPPLIES) ×2
PADDING CAST ABS COTTON 4X4 ST (CAST SUPPLIES) ×1 IMPLANT
PADDING CAST COTTON 4X4 STRL (CAST SUPPLIES) ×2
PENCIL BUTTON HOLSTER BLD 10FT (ELECTRODE) ×3 IMPLANT
PROBE BIPOLAR ATHRO 135MM 90D (MISCELLANEOUS) IMPLANT
SHEET MEDIUM DRAPE 40X70 STRL (DRAPES) IMPLANT
SLEEVE SCD COMPRESS KNEE MED (MISCELLANEOUS) IMPLANT
SPLINT PLASTER CAST XFAST 3X15 (CAST SUPPLIES) IMPLANT
SPLINT PLASTER XTRA FASTSET 3X (CAST SUPPLIES)
SPONGE LAP 4X18 X RAY DECT (DISPOSABLE) ×3 IMPLANT
STOCKINETTE 4X48 STRL (DRAPES) IMPLANT
SUCTION FRAZIER HANDLE 10FR (MISCELLANEOUS)
SUCTION TUBE FRAZIER 10FR DISP (MISCELLANEOUS) IMPLANT
SUT ETHILON 3 0 PS 1 (SUTURE) ×3 IMPLANT
SUT MON AB 4-0 PC3 18 (SUTURE) IMPLANT
SUT PROLENE 3 0 PS 2 (SUTURE) IMPLANT
SUT VIC AB 2-0 SH 27 (SUTURE)
SUT VIC AB 2-0 SH 27XBRD (SUTURE) IMPLANT
SUT VIC AB 3-0 FS2 27 (SUTURE) IMPLANT
SYR 20CC LL (SYRINGE) ×3 IMPLANT
SYR BULB 3OZ (MISCELLANEOUS) ×3 IMPLANT
TAPE CLOTH 3X10 TAN LF (GAUZE/BANDAGES/DRESSINGS) IMPLANT
TOWEL OR 17X24 6PK STRL BLUE (TOWEL DISPOSABLE) ×3 IMPLANT
TOWEL OR NON WOVEN STRL DISP B (DISPOSABLE) ×3 IMPLANT
TUBE CONNECTING 20'X1/4 (TUBING)
TUBE CONNECTING 20X1/4 (TUBING) IMPLANT
TUBING ARTHRO INFLOW-ONLY STRL (TUBING) ×3 IMPLANT
UNDERPAD 30X30 (UNDERPADS AND DIAPERS) ×3 IMPLANT
WATER STERILE IRR 1000ML POUR (IV SOLUTION) ×3 IMPLANT

## 2017-07-19 NOTE — Transfer of Care (Signed)
Immediate Anesthesia Transfer of Care Note  Patient: Monica Kerr  Procedure(s) Performed: KNEE ARTHROSCOPY WITH MEDIAL MENISCECTOMY, CHONDROPLASTY, SUBCHONDROPLASTY (Right Knee) PERCUTANEOUS SKELETAL FIXATION FEMORAL DISTAL END MEDIAL LATERAL SUPRACONDYLE TRANSCONDYLE (Right Knee)  Patient Location: PACU  Anesthesia Type:GA combined with regional for post-op pain  Level of Consciousness: drowsy  Airway & Oxygen Therapy: Patient Spontanous Breathing and Patient connected to face mask oxygen  Post-op Assessment: Report given to RN and Post -op Vital signs reviewed and stable  Post vital signs: Reviewed and stable  Last Vitals:  Vitals Value Taken Time  BP 125/73 07/19/2017  1:21 PM  Temp    Pulse 72 07/19/2017  1:24 PM  Resp 16 07/19/2017  1:24 PM  SpO2 100 % 07/19/2017  1:24 PM  Vitals shown include unvalidated device data.  Last Pain:  Vitals:   07/19/17 1006  TempSrc: Oral  PainSc:          Complications: No apparent anesthesia complications

## 2017-07-19 NOTE — Progress Notes (Signed)
AssistedDr. Rob Fitzgerald with right, ultrasound guided, adductor canal block. Side rails up, monitors on throughout procedure. See vital signs in flow sheet. Tolerated Procedure well.  

## 2017-07-19 NOTE — Interval H&P Note (Signed)
History and Physical Interval Note:  07/19/2017 9:41 AM  Christionna [place]apra  has presented today for surgery, with the diagnosis of RIGHT KNEE CHONDROMALACIA PATELLAE, MEDIAL MENISCUS TEAR, RIGHT PATHOLOGICAL FRACTURE OF FEMUR  The various methods of treatment have been discussed with the patient and family. After consideration of risks, benefits and other options for treatment, the patient has consented to  Procedure(s): KNEE ARTHROSCOPY WITH MEDIAL MENISCECTOMY, CHONDROPLASTY, SUBCHONDROPLASTY (Right) PERCUTANEOUS SKELETAL FIXATION FEMORAL DISTAL END MEDIAL LATERAL SUPRACONDYLE TRANSCONDYLE (Right) as a surgical intervention .  The patient's history has been reviewed, patient examined, no change in status, stable for surgery.  I have reviewed the patient's chart and labs.  Questions were answered to the patient's satisfaction.     Hatley Henegar D

## 2017-07-19 NOTE — Anesthesia Procedure Notes (Signed)
Procedure Name: LMA Insertion Date/Time: 07/19/2017 12:11 PM Performed by: Yolonda Kidaarver, Selim Durden L, CRNA Pre-anesthesia Checklist: Patient identified, Emergency Drugs available, Suction available and Patient being monitored Patient Re-evaluated:Patient Re-evaluated prior to induction Oxygen Delivery Method: Circle system utilized Preoxygenation: Pre-oxygenation with 100% oxygen Induction Type: IV induction Ventilation: Mask ventilation without difficulty LMA: LMA inserted LMA Size: 4.0 Number of attempts: 1 Placement Confirmation: positive ETCO2,  CO2 detector and breath sounds checked- equal and bilateral Tube secured with: Tape Dental Injury: Teeth and Oropharynx as per pre-operative assessment

## 2017-07-19 NOTE — Anesthesia Preprocedure Evaluation (Signed)
Anesthesia Evaluation  Patient identified by MRN, date of birth, ID band Patient awake    Reviewed: Allergy & Precautions, NPO status , Patient's Chart, lab work & pertinent test results  Airway Mallampati: II  TM Distance: >3 FB Neck ROM: Full    Dental no notable dental hx.    Pulmonary neg pulmonary ROS,    Pulmonary exam normal breath sounds clear to auscultation       Cardiovascular negative cardio ROS Normal cardiovascular exam Rhythm:Regular Rate:Normal     Neuro/Psych PSYCHIATRIC DISORDERS Depression negative neurological ROS     GI/Hepatic Neg liver ROS, GERD  ,  Endo/Other  Hypothyroidism   Renal/GU negative Renal ROS     Musculoskeletal  (+) Arthritis ,   Abdominal   Peds  Hematology negative hematology ROS (+)   Anesthesia Other Findings   Reproductive/Obstetrics negative OB ROS                             Anesthesia Physical Anesthesia Plan  ASA: II  Anesthesia Plan: General   Post-op Pain Management: GA combined w/ Regional for post-op pain   Induction: Intravenous  PONV Risk Score and Plan: 4 or greater and Ondansetron, Dexamethasone and Treatment may vary due to age or medical condition  Airway Management Planned: LMA  Additional Equipment:   Intra-op Plan:   Post-operative Plan: Extubation in OR  Informed Consent: I have reviewed the patients History and Physical, chart, labs and discussed the procedure including the risks, benefits and alternatives for the proposed anesthesia with the patient or authorized representative who has indicated his/her understanding and acceptance.   Dental advisory given  Plan Discussed with: CRNA  Anesthesia Plan Comments:         Anesthesia Quick Evaluation

## 2017-07-19 NOTE — Anesthesia Postprocedure Evaluation (Signed)
Anesthesia Post Note  Patient: Monica Kerr  Procedure(s) Performed: KNEE ARTHROSCOPY WITH MEDIAL MENISCECTOMY, CHONDROPLASTY, SUBCHONDROPLASTY (Right Knee) PERCUTANEOUS SKELETAL FIXATION FEMORAL DISTAL END MEDIAL LATERAL SUPRACONDYLE TRANSCONDYLE (Right Knee)     Patient location during evaluation: PACU Anesthesia Type: General Level of consciousness: awake and alert Pain management: pain level controlled Vital Signs Assessment: post-procedure vital signs reviewed and stable Respiratory status: spontaneous breathing, nonlabored ventilation, respiratory function stable and patient connected to nasal cannula oxygen Cardiovascular status: blood pressure returned to baseline and stable Postop Assessment: no apparent nausea or vomiting Anesthetic complications: no    Last Vitals:  Vitals:   07/19/17 1437 07/19/17 1515  BP:  (!) 147/73  Pulse: 77 80  Resp: 13 16  Temp:  36.7 C  SpO2: 97% 98%    Last Pain:  Vitals:   07/19/17 1524  TempSrc:   PainSc: 8                  Monica Kerr, Monica Kerr

## 2017-07-19 NOTE — Anesthesia Procedure Notes (Signed)
Anesthesia Regional Block: Adductor canal block   Pre-Anesthetic Checklist: ,, timeout performed, Correct Patient, Correct Site, Correct Laterality, Correct Procedure, Correct Position, site marked, Risks and benefits discussed,  Surgical consent,  Pre-op evaluation,  At surgeon's request and post-op pain management  Laterality: Right  Prep: chloraprep       Needles:  Injection technique: Single-shot  Needle Type: Echogenic Needle     Needle Length: 9cm  Needle Gauge: 21     Additional Needles:   Procedures:,,,, ultrasound used (permanent image in chart),,,,  Narrative:  Start time: 07/19/2017 11:32 AM End time: 07/19/2017 11:39 AM Injection made incrementally with aspirations every 5 mL.  Performed by: Personally  Anesthesiologist: Marcene DuosFitzgerald, Kymani Shimabukuro, MD

## 2017-07-19 NOTE — Interval H&P Note (Signed)
History and Physical Interval Note:  07/19/2017 9:39 AM  Monica Kerr  has presented today for surgery, with the diagnosis of RIGHT KNEE CHONDROMALACIA PATELLAE, MEDIAL MENISCUS TEAR, RIGHT PATHOLOGICAL FRACTURE OF FEMUR  The various methods of treatment have been discussed with the patient and family. After consideration of risks, benefits and other options for treatment, the patient has consented to  Procedure(s): KNEE ARTHROSCOPY WITH MEDIAL MENISCECTOMY, CHONDROPLASTY, SUBCHONDROPLASTY (Right) PERCUTANEOUS SKELETAL FIXATION FEMORAL DISTAL END MEDIAL LATERAL SUPRACONDYLE TRANSCONDYLE (Right) as a surgical intervention .  The patient's history has been reviewed, patient examined, no change in status, stable for surgery.  I have reviewed the patient's chart and labs.  Questions were answered to the patient's satisfaction.     Alahna Dunne D

## 2017-07-19 NOTE — Discharge Instructions (Signed)
Post Anesthesia Home Care Instructions  Activity: Get plenty of rest for the remainder of the day. A responsible individual must stay with you for 24 hours following the procedure.  For the next 24 hours, DO NOT: -Drive a car -Advertising copywriterperate machinery -Drink alcoholic beverages -Take any medication unless instructed by your physician -Make any legal decisions or sign important papers.  Meals: Start with liquid foods such as gelatin or soup. Progress to regular foods as tolerated. Avoid greasy, spicy, heavy foods. If nausea and/or vomiting occur, drink only clear liquids until the nausea and/or vomiting subsides. Call your physician if vomiting continues.  Special Instructions/Symptoms: Your throat may feel dry or sore from the anesthesia or the breathing tube placed in your throat during surgery. If this causes discomfort, gargle with warm salt water. The discomfort should disappear within 24 hours.  If you had a scopolamine patch placed behind your ear for the management of post- operative nausea and/or vomiting:  1. The medication in the patch is effective for 72 hours, after which it should be removed.  Wrap patch in a tissue and discard in the trash. Wash hands thoroughly with soap and water. 2. You may remove the patch earlier than 72 hours if you experience unpleasant side effects which may include dry mouth, dizziness or visual disturbances. 3. Avoid touching the patch. Wash your hands with soap and water after contact with the patch.   Keep leg elevated with ice to reduce pain and swelling. You may increase pain medication up to 2 tablets every 4 hours for the first few days post op if needed.  Diet: As you were doing prior to hospitalization   Dressing:  You may remove dressings and shower over incisions 3 days after surgery.  Place clean Band-Aid over incisions.   Activity:  Increase activity slowly as tolerated, but follow the weight bearing instructions below.  The rules on  driving is that you can not be taking narcotics while you drive, and you must feel in control of the vehicle.    Weight Bearing: As tolerated   To prevent constipation: you may use a stool softener such as -  Colace (over the counter) 100 mg by mouth twice a day  Drink plenty of fluids (prune juice may be helpful) and high fiber foods Miralax (over the counter) for constipation as needed.    Itching:  If you experience itching with your medications, try taking only a single pain pill, or even half a pain pill at a time.  You can also use benadryl over the counter for itching or also to help with sleep.   Precautions:  If you experience chest pain or shortness of breath - call 911 immediately for transfer to the hospital emergency department!!  If you develop a fever greater that 101 F, purulent drainage from wound, increased redness or drainage from wound, or calf pain -- Call the office at [phone]                                                 Follow- Up Appointment:  Please call for an appointment to be seen in 2 weeks [place] - [phone]   Regional Anesthesia Blocks  1. Numbness or the inability to move the "blocked" extremity may last from 3-48 hours after placement. The length of time depends on the medication injected  and your individual response to the medication. If the numbness is not going away after 48 hours, call your surgeon.  2. The extremity that is blocked will need to be protected until the numbness is gone and the  Strength has returned. Because you cannot feel it, you will need to take extra care to avoid injury. Because it may be weak, you may have difficulty moving it or using it. You may not know what position it is in without looking at it while the block is in effect.  3. For blocks in the legs and feet, returning to weight bearing and walking needs to be done carefully. You will need to wait until the numbness is entirely gone and the strength has  returned. You should be able to move your leg and foot normally before you try and bear weight or walk. You will need someone to be with you when you first try to ensure you do not fall and possibly risk injury.  4. Bruising and tenderness at the needle site are common side effects and will resolve in a few days.  5. Persistent numbness or new problems with movement should be communicated to the surgeon or the [facility] [phone] [facility] [phone]).

## 2017-07-19 NOTE — Op Note (Signed)
07/19/2017  1:32 PM  PATIENT:  Monica Kerr    PRE-OPERATIVE DIAGNOSIS:  RIGHT KNEE CHONDROMALACIA PATELLAE, MEDIAL MENISCUS TEAR, RIGHT PATHOLOGICAL FRACTURE OF FEMUR  POST-OPERATIVE DIAGNOSIS:  Same  PROCEDURE:  KNEE ARTHROSCOPY WITH MEDIAL MENISCECTOMY, CHONDROPLASTY, SUBCHONDROPLASTY, PERCUTANEOUS SKELETAL FIXATION FEMORAL DISTAL END MEDIAL LATERAL SUPRACONDYLE TRANSCONDYLE  SURGEON:  Tranisha Tissue D, MD  ASSISTANT: Aquilla HackerHenry Martensen, PA-C, he was present and scrubbed throughout the case, critical for completion in a timely fashion, and for retraction, instrumentation, and closure.   ANESTHESIA:   General  BLOOD LOSS: min  COMPLICATIONS: None   PREOPERATIVE INDICATIONS:  [name] is a  77 y.o. female with a diagnosis of RIGHT KNEE CHONDROMALACIA PATELLAE, MEDIAL MENISCUS TEAR, RIGHT PATHOLOGICAL FRACTURE OF FEMUR who failed conservative measures and elected for surgical management.    The risks benefits and alternatives were discussed with the patient preoperatively including but not limited to the risks of infection, bleeding, nerve injury, cardiopulmonary complications, the need for revision surgery, among others, and the patient was willing to proceed.  OPERATIVE IMPLANTS: subchondroplasty cement  OPERATIVE FINDINGS:Grade 4 chondral lesion MFC, Radial medial meniscus tear  SUBCHONDROPLASTY LOCATION: Medial femoral chondyle   OPERATIVE PROCEDURE:  Patient was identified in the preoperative holding area and site was marked by me female was transported to the operating theater and placed on the table in supine position taking care to pad all bony prominences. After a preincinduction time out anesthesia was induced.  female received ancef for preoperative antibiotics. The right lower extremity was prepped and draped in normal sterile fashion and a pre-incision timeout was performed.   A small stab incision was made in the anterolateral portal position. The arthroscope  was introduced in the joint. A medial portal was then established under direct visualization just above the anterior horn of the medial meniscus. Diagnostic arthroscopy was then carried out with findings as described above.  I performed a Medial femoral chondyle chondroplasty and a partial medial meniscectomy  I then removed the arthroscope.  I used fluoroscopic guidance to insert the trocar of the scope into the area of subchondral edema as guided by MRI.  Once I confirmed appropriate placement by the trocar on fluoroscopy I injected cement I visually observed on fluoroscopy the good filling of the defect.   Next I reinserted the arthroscope and confirmed no articular penetration of cement.  The arthroscopic equipment was removed from the joint and the portals were closed with 3-0 nylon in an interrupted fashion. The knee was infiltrated with depomedrol.  Sterile dressings were then applied including Xeroform 4 x 4's ABDs an ACE bandage.  The patient was then allowed to awaken from general anesthesia, transferred to the stretcher and taken to the recovery room in stable condition.  POSTOPERATIVE PLAN: The patient will be discharged home today and will followup in one week for suture removal and wound check.  VTE prophylaxis: chemical and mobilization

## 2017-07-20 ENCOUNTER — Encounter (HOSPITAL_BASED_OUTPATIENT_CLINIC_OR_DEPARTMENT_OTHER): Payer: Self-pay | Admitting: Orthopedic Surgery

## 2017-07-21 NOTE — Addendum Note (Signed)
Addendum  created 07/21/17 0707 by [place] DesanctisLinka, Lashell Moffitt L, CRNA   Charge Capture section accepted

## 2017-07-29 ENCOUNTER — Other Ambulatory Visit (HOSPITAL_COMMUNITY): Payer: Self-pay | Admitting: Orthopedic Surgery

## 2017-07-29 ENCOUNTER — Ambulatory Visit (HOSPITAL_COMMUNITY)
Admission: RE | Admit: 2017-07-29 | Discharge: 2017-07-29 | Disposition: A | Discharge status: Home / Self Care | Payer: Medicare Other | Source: Ambulatory Visit | Attending: Orthopedic Surgery | Admitting: Orthopedic Surgery

## 2017-07-29 DIAGNOSIS — M79604 Pain in right leg: Secondary | ICD-10-CM

## 2017-07-29 DIAGNOSIS — R52 Pain, unspecified: Secondary | ICD-10-CM | POA: Insufficient documentation

## 2017-07-29 DIAGNOSIS — M7989 Other specified soft tissue disorders: Principal | ICD-10-CM

## 2017-07-29 DIAGNOSIS — R609 Edema, unspecified: Secondary | ICD-10-CM | POA: Insufficient documentation

## 2017-07-29 NOTE — Progress Notes (Signed)
Right lower extremity venous duplex completed. Preliminary results - Thee is no evidnce of DVT, superficial thro,bosis, or Baker's cyst. Toma DeitersVirginia Dayon Witt, RVS 07/29/2017 3:42 PM

## 2019-08-04 ENCOUNTER — Emergency Department (HOSPITAL_BASED_OUTPATIENT_CLINIC_OR_DEPARTMENT_OTHER)
Admission: EM | Admit: 2019-08-04 | Discharge: 2019-08-04 | Disposition: A | Discharge status: Home / Self Care | Payer: Medicare Other | Attending: Emergency Medicine | Admitting: Emergency Medicine

## 2019-08-04 ENCOUNTER — Other Ambulatory Visit: Payer: Self-pay

## 2019-08-04 ENCOUNTER — Emergency Department (HOSPITAL_BASED_OUTPATIENT_CLINIC_OR_DEPARTMENT_OTHER): Payer: Medicare Other

## 2019-08-04 ENCOUNTER — Encounter (HOSPITAL_BASED_OUTPATIENT_CLINIC_OR_DEPARTMENT_OTHER): Payer: Self-pay | Admitting: Emergency Medicine

## 2019-08-04 DIAGNOSIS — Z7982 Long term (current) use of aspirin: Secondary | ICD-10-CM | POA: Diagnosis not present

## 2019-08-04 DIAGNOSIS — M47896 Other spondylosis, lumbar region: Secondary | ICD-10-CM | POA: Diagnosis not present

## 2019-08-04 DIAGNOSIS — E039 Hypothyroidism, unspecified: Secondary | ICD-10-CM | POA: Diagnosis not present

## 2019-08-04 DIAGNOSIS — M5431 Sciatica, right side: Secondary | ICD-10-CM | POA: Insufficient documentation

## 2019-08-04 DIAGNOSIS — M5136 Other intervertebral disc degeneration, lumbar region: Secondary | ICD-10-CM | POA: Diagnosis not present

## 2019-08-04 DIAGNOSIS — M5489 Other dorsalgia: Secondary | ICD-10-CM | POA: Diagnosis present

## 2019-08-04 DIAGNOSIS — Z79899 Other long term (current) drug therapy: Secondary | ICD-10-CM | POA: Diagnosis not present

## 2019-08-04 DIAGNOSIS — M4306 Spondylolysis, lumbar region: Secondary | ICD-10-CM

## 2019-08-04 LAB — CBC
HCT: 44.9 % (ref 36.0–46.0)
Hemoglobin: 14.7 g/dL (ref 12.0–15.0)
MCH: 28.7 pg (ref 26.0–34.0)
MCHC: 32.7 g/dL (ref 30.0–36.0)
MCV: 87.5 fL (ref 80.0–100.0)
Platelets: 344 10*3/uL (ref 150–400)
RBC: 5.13 MIL/uL — ABNORMAL HIGH (ref 3.87–5.11)
RDW: 14.7 % (ref 11.5–15.5)
WBC: 6.9 10*3/uL (ref 4.0–10.5)
nRBC: 0 % (ref 0.0–0.2)

## 2019-08-04 LAB — I-STAT CHEM 8, ED
BUN: 18 mg/dL (ref 8–23)
Calcium, Ion: 1.22 mmol/L (ref 1.15–1.40)
Chloride: 102 mmol/L (ref 98–111)
Creatinine, Ser: 0.7 mg/dL (ref 0.44–1.00)
Glucose, Bld: 107 mg/dL — ABNORMAL HIGH (ref 70–99)
HCT: 40 % (ref 36.0–46.0)
Hemoglobin: 13.6 g/dL (ref 12.0–15.0)
Potassium: 4.1 mmol/L (ref 3.5–5.1)
Sodium: 139 mmol/L (ref 135–145)
TCO2: 30 mmol/L (ref 22–32)

## 2019-08-04 MED ORDER — OXYCODONE-ACETAMINOPHEN 5-325 MG PO TABS
1.0000 | ORAL_TABLET | Freq: Once | ORAL | Status: AC
  Administered 2019-08-04: 1 via ORAL
  Filled 2019-08-04: qty 1

## 2019-08-04 MED ORDER — OXYCODONE-ACETAMINOPHEN 7.5-325 MG PO TABS: 1.0000 | ORAL_TABLET | Freq: Four times a day (QID) | ORAL | 0 refills | Status: AC | PRN

## 2019-08-04 MED ORDER — ONDANSETRON 4 MG PO TBDP: 4.0000 mg | ORAL_TABLET | Freq: Three times a day (TID) | ORAL | 0 refills | Status: DC | PRN

## 2019-08-04 MED ORDER — ONDANSETRON 4 MG PO TBDP: 4.0000 mg | ORAL_TABLET | Freq: Three times a day (TID) | ORAL | 0 refills | Status: AC | PRN

## 2019-08-04 MED ORDER — ONDANSETRON HCL 4 MG/2ML IJ SOLN
4.0000 mg | Freq: Once | INTRAMUSCULAR | Status: AC
  Administered 2019-08-04: 4 mg via INTRAVENOUS
  Filled 2019-08-04: qty 2

## 2019-08-04 MED ORDER — METHYLPREDNISOLONE SODIUM SUCC 125 MG IJ SOLR
80.0000 mg | Freq: Once | INTRAMUSCULAR | Status: AC
  Administered 2019-08-04: 80 mg via INTRAVENOUS
  Filled 2019-08-04: qty 2

## 2019-08-04 MED ORDER — SENNOSIDES-DOCUSATE SODIUM 8.6-50 MG PO TABS: 1.0000 | ORAL_TABLET | Freq: Every day | ORAL | 0 refills | Status: AC

## 2019-08-04 MED ORDER — FENTANYL CITRATE (PF) 100 MCG/2ML IJ SOLN
50.0000 ug | Freq: Once | INTRAMUSCULAR | Status: AC
  Administered 2019-08-04: 50 ug via INTRAVENOUS
  Filled 2019-08-04: qty 2

## 2019-08-04 NOTE — ED Provider Notes (Signed)
Spoke with Jearld Adjutant, pharmacist at [facility]. The Zofran that was prescribed to the patient is not covered under her insurance and requires prior authorization. The non-ODT formulation is also not covered under her insurance. We discussed recommending over-the-counter medications possibly or giving a prescription assistance card or doing a partial fill. Also discussed following up with PCP to prescribe the medication and perform prior authorization. She will discuss this with the patient.   Jeanie Sewer, PA-C 08/04/19 1710    Terald Sleeper, MD 08/05/19 [phone]

## 2019-08-04 NOTE — ED Notes (Signed)
ED Provider at bedside. 

## 2019-08-04 NOTE — ED Notes (Signed)
Patient transported to MRI 

## 2019-08-04 NOTE — ED Provider Notes (Signed)
[facility] EMERGENCY DEPARTMENT Provider Note   CSN: [id number] Arrival date & time: 08/04/19  1053     History Chief Complaint  Patient presents with  . Back Pain    Monica Kerr is a 79 y.o. female history of chronic low back pain, sciatica, presented to emergency department with low back pain radiating down her right leg.  She reports this is worsened in the past 2 weeks.  She has suffered from low back pain for many years.  She said an extensive outpatient work-up including MRI of the spine is recent is 2020.  She does work in physical therapy.  Her physical therapist is concerned about the degree of pain that she has been in the past several days.  Her son Alinda Money at the bedside tells me her mother is been unable to sleep.  She is able to ambulate but is having difficulty due to pain.  There is no incontinence.  She does have some paresthesias in her right lower leg.  She has tried tramadol 25 mg but gets "dizzy and sick" after taking it.  Her pain is 10/10 in the ED.  It travels to her right knee and right toes.  She has reportedly had epidural and spinal injections in the past with no relief.  She has not yet seen a spine surgeon.  Most recent MRI [facility] on 04/01/19 at [facility]:  CLINICAL DATA: 79 year old female with neck and low back pain. Right greater than left lower extremity radiculopathy. Neck pain radiates to the shoulder.  EXAM: MRI LUMBAR SPINE WITHOUT CONTRAST  TECHNIQUE: Multiplanar, multisequence MR imaging of the lumbar spine was performed. No intravenous contrast was administered.  COMPARISON: None.  FINDINGS: Segmentation: Lumbar segmentation appears to be normal and will be designated as such for this report.  Alignment: Mild degenerative appearing retrolisthesis of L3 on L4. Similar retrolisthesis of L2 on L3 and L4 on L5. Overall mild straightening of lumbar lordosis.  Vertebrae: No marrow edema or evidence of acute osseous  abnormality. Visualized bone marrow signal is within normal limits. Intact visible sacrum and SI joints.  Conus medullaris and cauda equina: Conus extends to the L1 level. No lower spinal cord or conus signal abnormality.  Paraspinal and other soft tissues: Negative.  Disc levels:  T11-T12: Negative.  T12-L1: Negative.  L1-L2: Moderate size central disc extrusion with both cephalad and caudal migration of disc material (series 2, image 8). Mild superimposed disc bulging and posterior element hypertrophy. No lateral recess stenosis. No significant spinal stenosis here at the level of the conus. Incidental left L1 nerve root diverticulum (typically a normal variant).  L2-L3: Disc space loss with circumferential disc osteophyte complex eccentric to the right. Up to mild facet hypertrophy. Borderline to mild right lateral recess stenosis (right L3 nerve level). No convincing spinal or foraminal stenosis.  L3-L4: Circumferential mostly far lateral disc bulge with mild posterior element hypertrophy. No spinal or lateral recess stenosis. Mild to moderate bilateral L3 foraminal stenosis.  L4-L5: Circumferential disc bulge eccentric to the left. Mild to moderate posterior element hypertrophy. No spinal or lateral recess stenosis. Moderate left and mild right L4 foraminal stenosis.  L5-S1: Circumferential disc bulge. Moderate to severe facet hypertrophy with degenerative facet joint fluid on the left, and also a small 5 millimeter synovial cyst projecting into the left L5 neural foramen (series 6, image 31). No spinal or lateral recess stenosis. Severe left and moderate right L5 foraminal stenosis.  IMPRESSION: 1. Capacious lumbar spinal canal with no  spinal stenosis despite lumbar disc degeneration which is most pronounced at L1-L2 and L2-L3. 2. At L5-S1 there is severe facet arthropathy including a small synovial cyst projecting into the left neural foramen. Severe left and  moderate right foraminal stenosis. Query Left L5 radiculitis. 3. Multifactorial moderate neural foraminal stenosis also at the bilateral L3 and left L4 nerve levels.   Electronically Signed  By: Genevie Ann M.D. HPI     Past Medical History:  Diagnosis Date  . 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 Depression   . GERD (gastroesophageal reflux disease)    no current med.  . Hyperlipidemia   . Hypothyroidism   . Medial meniscus tear 07/2017   right  . Pathological fracture of femur ([place]) 07/2017   right  . Seasonal allergies     Patient Active Problem List   Diagnosis Date Noted  . Major depressive disorder with single episode, in full remission ([place]) 04/15/2017  . Right knee pain 03/23/2017  . Overweight 01/29/2016  . Acquired hypothyroidism 10/17/2015  . Mixed emotional features as adjustment reaction 10/17/2015  . DJD (degenerative joint disease) of cervical spine 10/17/2015  . DJD (degenerative joint disease), lumbosacral 10/17/2015  . Generalized osteoarthritis of multiple sites 10/17/2015  . GERD (gastroesophageal reflux disease) 10/17/2015  . Hyperlipidemia 10/17/2015  . 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 Abdominal bloating 02/08/2011  . Burning sensation of mouth 02/08/2011  . Chest pain 02/08/2011  . Dysphagia 02/08/2011  . Early satiety 02/08/2011    Past Surgical History:  Procedure Laterality Date  . APPENDECTOMY    . CATARACT EXTRACTION W/ INTRAOCULAR LENS  IMPLANT, BILATERAL    . KNEE ARTHROSCOPY WITH MEDIAL MENISECTOMY Right 07/19/2017   Procedure: KNEE ARTHROSCOPY WITH MEDIAL MENISCECTOMY, CHONDROPLASTY, SUBCHONDROPLASTY;  Surgeon: Renette Butters, MD;  Location: [facility];  Service: Orthopedics;  Laterality: Right;  . PERCUTANEOUS PINNING Right 07/19/2017   Procedure: PERCUTANEOUS SKELETAL FIXATION FEMORAL DISTAL END MEDIAL LATERAL SUPRACONDYLE  [place];  Surgeon: Renette Butters, MD;  Location: [facility];  Service: Orthopedics;  Laterality: Right;     OB History   No obstetric history on file.     No family history on file.  Social History   Tobacco Use  . Smoking status: Never Smoker  . Smokeless tobacco: Never Used  Substance Use Topics  . Alcohol use: Never  . Drug use: Never    Home Medications Prior to Admission medications   Medication Sig Start Date End Date Taking? Authorizing Provider  aspirin EC 81 MG tablet Take 1 tablet (81 mg total) by mouth daily. For 30 days for DVT prophylaxis. 07/19/17   Martensen, Charna Elizabeth III, PA-C  baclofen (LIORESAL) 10 MG tablet Take 1 tablet (10 mg total) by mouth 3 (three) times daily as needed for muscle spasms. 07/19/17   Prudencio Burly III, PA-C  calcium carbonate (CALCIUM 600) 600 MG TABS tablet Take 600 mg by mouth 2 (two) times daily with a meal.    [provider]  citalopram (CELEXA) 20 MG tablet Take 20 mg by mouth. 09/13/16   [provider]  docusate sodium (COLACE) 100 MG capsule Take 1 capsule (100 mg total) by mouth 2 (two) times daily. To prevent constipation while taking pain medication. 07/19/17   Prudencio Burly III, PA-C  levothyroxine (SYNTHROID, LEVOTHROID) 25 MCG tablet TAKE 1 TABLET BY MOUTH DAILY 09/23/16   [provider]  loratadine (CLARITIN) 10 MG tablet Take 10 mg by mouth daily.    [provider]  Multiple Vitamin (MULTIVITAMIN) tablet Take 1 tablet by mouth daily.    [provider]  nortriptyline (PAMELOR) 50 MG capsule Take 50 mg by mouth. 11/05/16   [provider]  ondansetron (ZOFRAN ODT) 4 MG disintegrating tablet Take 1 tablet (4 mg total) by mouth every 8 (eight) hours as needed for up to 14 doses for nausea or vomiting. 08/04/19   Terald Sleeper, MD  ondansetron (ZOFRAN) 4 MG tablet Take 1 tablet (4 mg total) by mouth every 8 (eight) hours as needed for  nausea or vomiting. 07/19/17   Albina Billet III, PA-C  oxyCODONE-acetaminophen (ENDOCET) 7.5-325 MG tablet Take 1 tablet by mouth every 6 (six) hours as needed for up to 15 days for severe pain. 08/04/19 08/19/19  Terald Sleeper, MD  pantoprazole (PROTONIX) 40 MG tablet Take 1 tablet (40 mg total) by mouth daily. For gastroprotection when taking NSAID postoperatively. 07/19/17 08/18/17  Albina Billet III, PA-C  rosuvastatin (CRESTOR) 20 MG tablet TAKE 1 TABLET BY MOUTH DAILY. GENERIC FOR CRESTOR 09/13/16   [provider]  senna-docusate (SENOKOT-S) 8.6-50 MG tablet Take 1 tablet by mouth daily. 08/04/19 10/03/19  Terald Sleeper, MD  zolpidem (AMBIEN) 10 MG tablet TAKE 1 TABLET BY MOUTH EVERY NIGHT AT BEDTIME AS NEEDED FOR SLEEP 11/12/16   [provider]    Allergies    Patient has no known allergies.  Review of Systems   Review of Systems  Constitutional: Negative for chills and fever.  Musculoskeletal: Positive for arthralgias and back pain. Negative for myalgias.  Skin: Negative for rash and wound.  Neurological: Positive for weakness and numbness.  Psychiatric/Behavioral: Negative for agitation and confusion.  All other systems reviewed and are negative.   Physical Exam Updated Vital Signs BP 139/67 (BP Location: Left Arm)   Pulse 79   Temp 97.9 F (36.6 C) (Oral)   Resp 20   Ht 5' (1.524 m)   Wt 79.4 kg   SpO2 92%   BMI 34.18 kg/m   Physical Exam Vitals and nursing note reviewed.  Constitutional:      General: She is not in acute distress.    Appearance: She is well-developed.  HENT:     Head: Normocephalic and atraumatic.  Eyes:     Conjunctiva/sclera: Conjunctivae normal.  Cardiovascular:     Rate and Rhythm: Normal rate and regular rhythm.  Pulmonary:     Effort: Pulmonary effort is normal. No respiratory distress.  Musculoskeletal:     Cervical back: Neck supple.  Skin:    General: Skin is warm and dry.  Neurological:      Mental Status: She is alert.     Sensory: No sensory deficit.     Motor: No weakness.     Comments: Back pain reproducible with forward hip flexion and lying back Able to ambulate  Psychiatric:        Mood and Affect: Mood normal.        Behavior: Behavior normal.     ED Results / Procedures / Treatments   Labs (all labs ordered are listed, but only abnormal results are displayed) Labs Reviewed  CBC - Abnormal; Notable for the following components:      Result Value   RBC 5.13 (*)    All other components within normal limits  I-STAT CHEM 8, ED - Abnormal; Notable for the following components:  Glucose, Bld 107 (*)    All other components within normal limits    EKG None  Radiology MR LUMBAR SPINE WO CONTRAST  Result Date: 08/04/2019 CLINICAL DATA:  Low back pain EXAM: MRI LUMBAR SPINE WITHOUT CONTRAST TECHNIQUE: Multiplanar, multisequence MR imaging of the lumbar spine was performed. No intravenous contrast was administered. COMPARISON:  04/01/2019 FINDINGS: Despite efforts by the technologist and patient, motion artifact is present on today's exam and could not be eliminated. This reduces exam sensitivity and specificity. Segmentation: The lowest lumbar type non-rib-bearing vertebra is labeled as L5. Alignment: Stable grade 1 degenerative retrolisthesis at L2-3, L3-4, and L4-5. Subtle levoconvex lumbar scoliosis with rotary component. Vertebrae: Note is again made of type 2 degenerative endplate findings at L2-3 with disc desiccation throughout the lumbar spine with loss of disc height L2-3. Conus medullaris and cauda equina: Conus extends to the L1 level. Conus and cauda equina appear normal. Paraspinal and other soft tissues: Unremarkable Disc levels: L1-2: No impingement. Central disc protrusion. L2-3: No impingement. Left paracentral and right inferior foraminal disc protrusion superimposed on disc bulge. L3-4: Mild right foraminal stenosis and mild displacement of both L3 nerves  in the lateral extraforaminal space due to disc bulge, facet arthropathy, and intervertebral spurring. Borderline bilateral subarticular lateral recess stenosis. Similar appearance to prior. L4-5: Borderline central narrowing of the thecal sac and borderline bilateral foraminal stenosis due to disc bulge and facet arthropathy, similar to the prior exam. Small synovial cysts adjacent to the facet joints, not currently in a position to cause impingement. L5-S1: Moderate to prominent left and mild right foraminal stenosis due to disc bulge, facet arthropathy, and left foraminal synovial cyst, similar to the prior exam. IMPRESSION: 1. Lumbar spondylosis and degenerative disc disease, causing moderate to prominent impingement at L5-S1 and mild impingement at L3-4, as detailed above. Overall findings are stable from prior. At L5-S1, a synovial cyst also contributes to the left foraminal impingement. Electronically Signed   By: Gaylyn RongWalter  Liebkemann M.D.   On: 08/04/2019 14:04    Procedures Procedures (including critical care time)  Medications Ordered in ED Medications  fentaNYL (SUBLIMAZE) injection 50 mcg (50 mcg Intravenous Given 08/04/19 1238)  ondansetron (ZOFRAN) injection 4 mg (4 mg Intravenous Given 08/04/19 1234)  methylPREDNISolone sodium succinate (SOLU-MEDROL) 125 mg/2 mL injection 80 mg (80 mg Intravenous Given 08/04/19 1236)  oxyCODONE-acetaminophen (PERCOCET/ROXICET) 5-325 MG per tablet 1 tablet (1 tablet Oral Given 08/04/19 1410)    ED Course  I have reviewed the triage vital signs and the nursing notes.  Pertinent labs & imaging results that were available during my care of the patient were reviewed by me and considered in my medical decision making (see chart for details).  79 yo female w/ chronic DDD as noted in MRI above, presenting to ED with acute on chronic pain in right lower back and sciatica into her right lower leg.  She is ambulatory.  No red flags for cord compression at this time.   She does appear to be in distress.  Her son Alinda Moneyony requests stronger pain medications.  We discussed the pros and cons of opioids but agreed to a trial of IV fentanyl, which is short-acting, to see how well she does with it while here.  If this is successful we can transition to percocet for a few days until she can see a spine surgeon.  We have MRI available today, I agreed to repeat imaging, which may help with her spine surgeon's office evaluation.  We'll reassess  her after her pain medications  Clinical Course as of Aug 03 1629  Sat Aug 04, 2019  1416 IMPRESSION: 1. Lumbar spondylosis and degenerative disc disease, causing moderate to prominent impingement at L5-S1 and mild impingement at L3-4, as detailed above. Overall findings are stable from prior. At L5-S1, a synovial cyst also contributes to the left foraminal impingement.    [MT]  1502 Was feeling better after Iv meds, now having pain returning.  I discussed going home with PO meds vs IV meds observation for pain control.  I do not think there is grounds for emergent neurosurgical consultaiton, but I would refer them to the spine clinic as an outpatient.  She would prefer to go home.     [MT]    Clinical Course User Index [MT] Terald Sleeper, MD    Final Clinical Impression(s) / ED Diagnoses Final diagnoses:  Lumbar spondylolysis  Degenerative disc disease, lumbar  Sciatica of right side    Rx / DC Orders ED Discharge Orders         Ordered    senna-docusate (SENOKOT-S) 8.6-50 MG tablet  Daily     08/04/19 1506    oxyCODONE-acetaminophen (ENDOCET) 7.5-325 MG tablet  Every 6 hours PRN     08/04/19 1506    ondansetron (ZOFRAN ODT) 4 MG disintegrating tablet  Every 8 hours PRN     08/04/19 1506           Terald Sleeper, MD 08/04/19 1631

## 2019-08-04 NOTE — ED Notes (Signed)
Pt discharged to home. Discharge instructions have been discussed with patient and/or family members. Pt verbally acknowledges understanding d/c instructions, and endorses comprehension to checkout at registration before leaving.  °

## 2019-08-04 NOTE — ED Triage Notes (Signed)
R low back pain radiating down R leg x 2 weeks. This is a recurring problem.

## 2020-07-29 IMAGING — MR MR LUMBAR SPINE W/O CM
4 of 5 series · 25 of 48 positions shown · non-contrast
Comparison: 04/01/2019

CLINICAL DATA: Low back pain

EXAM:
MRI LUMBAR SPINE WITHOUT CONTRAST
TECHNIQUE: Multiplanar, multisequence MR imaging of the lumbar spine was
performed. No intravenous contrast was administered.

[Series 2: T1 · sagittal · 4.0mm · 0.81mm/px · 6 of 16 slices shown (1 of 2)]
[im 1/16]
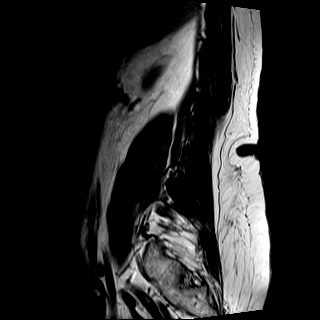
[im 4/16]
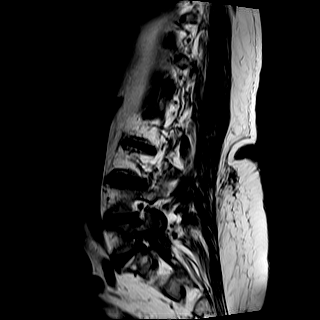
[im 7/16]
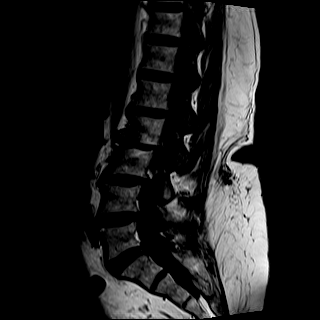
[im 10/16]
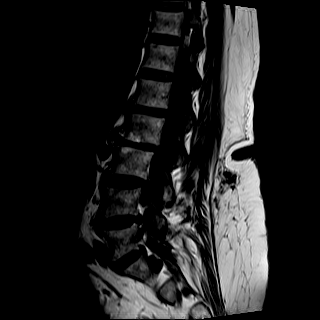
[im 13/16]
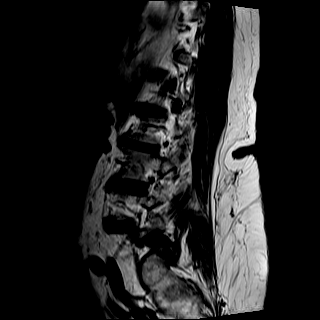
[im 16/16]
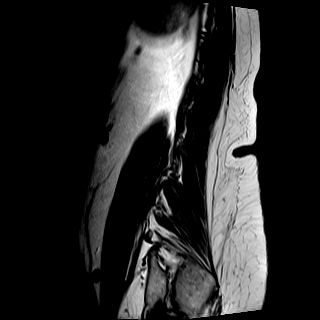

[Series 3: T2 · sagittal · 4.0mm · 0.81mm/px · 6 of 16 slices shown (1 of 2)]
[im 1/16]
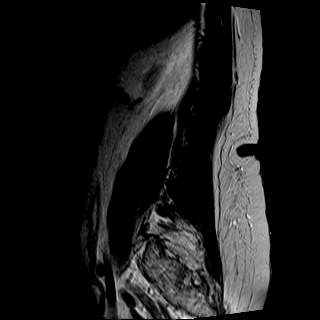
[im 4/16]
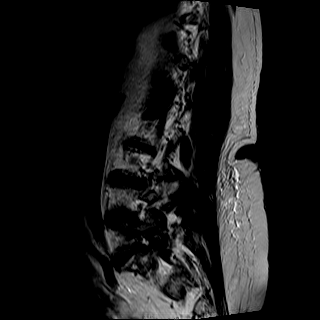
[im 7/16]
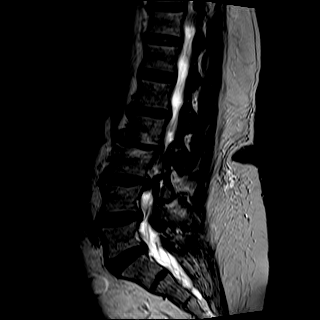
[im 10/16]
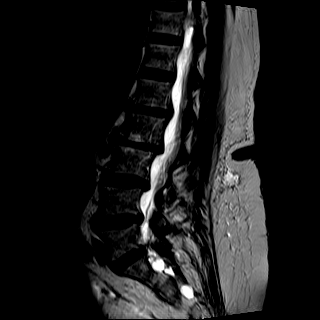
[im 13/16]
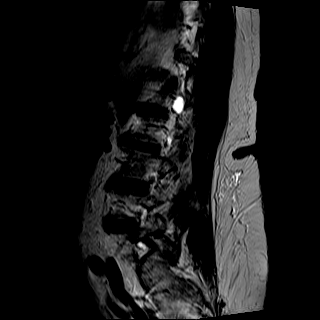
[im 16/16]
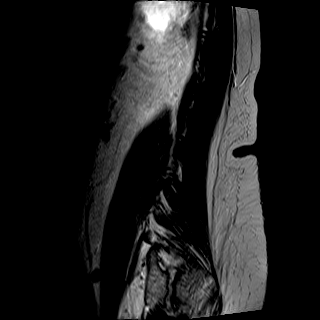

[Series 6: T2 · axial · 4.0mm · 0.39mm/px · z∈[-13,+172]mm · 9 of 38 slices shown (2 of 2)]
[im 1/38]
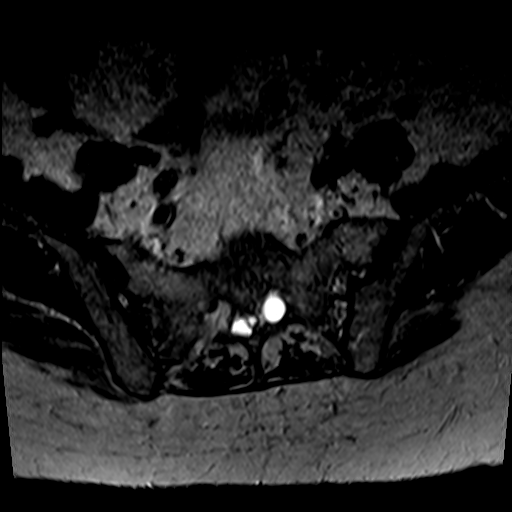
[im 6/38]
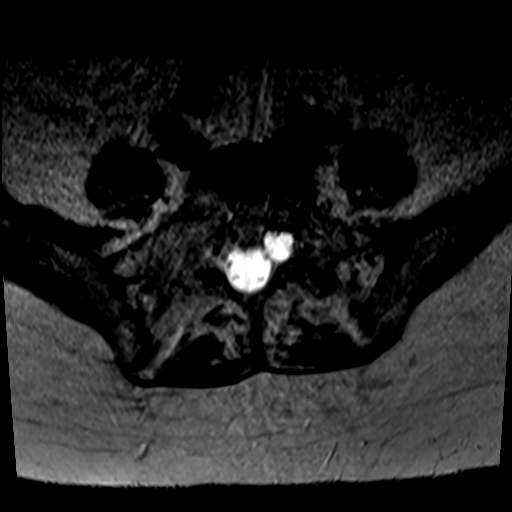
[im 11/38]
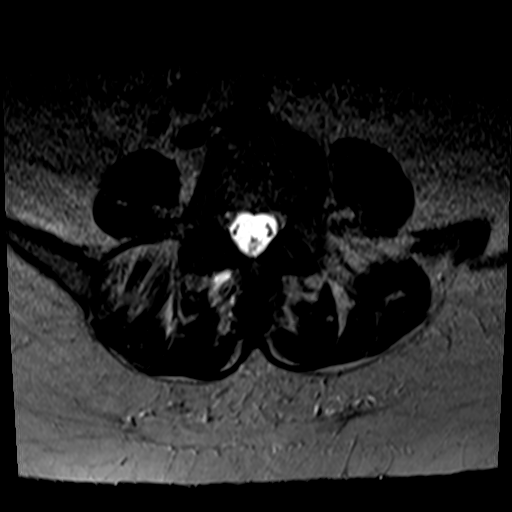
[im 16/38]
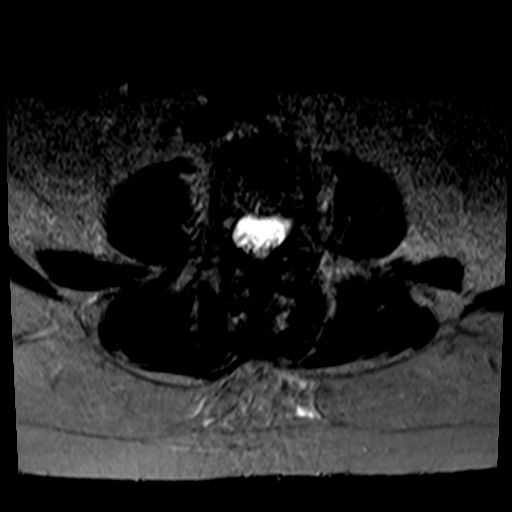
[im 19/38]
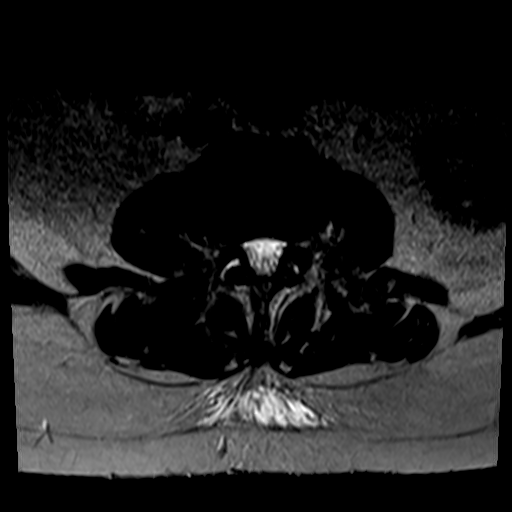
[im 22/38]
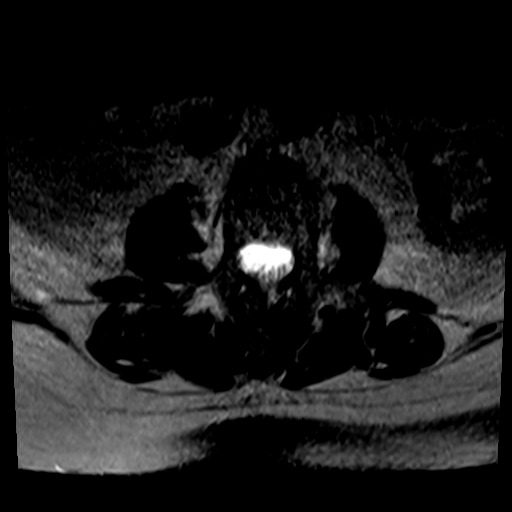
[im 27/38]
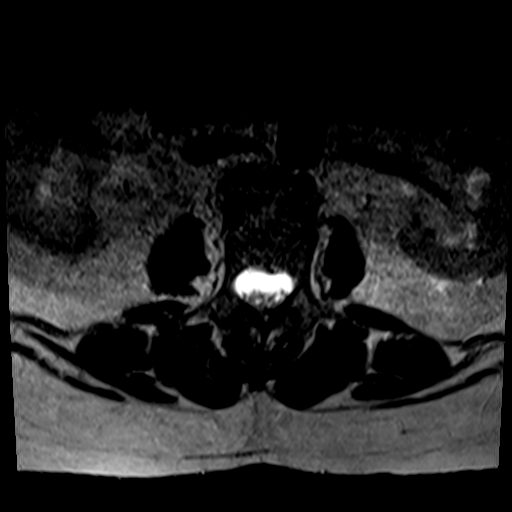
[im 32/38]
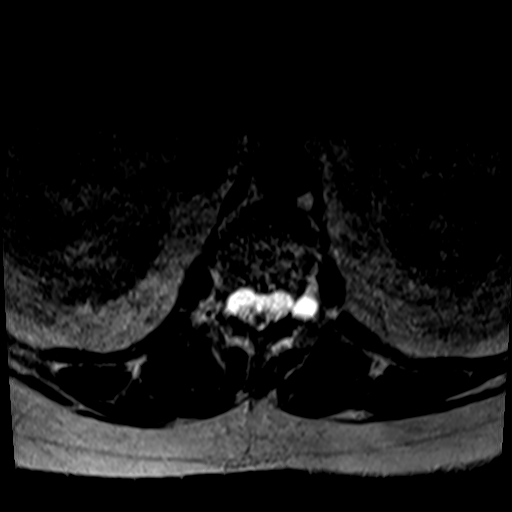
[im 38/38]
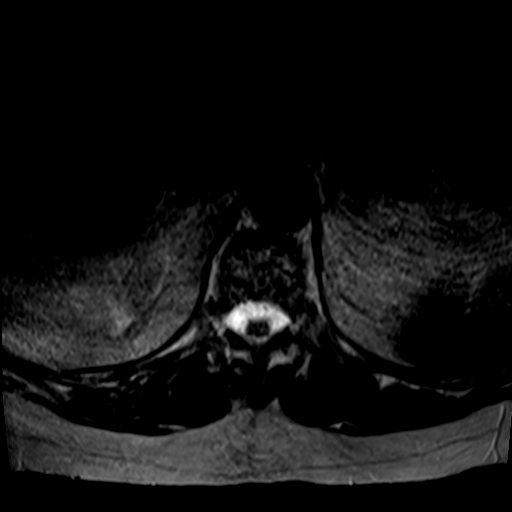

[Series 7: T1 · axial · 4.0mm · 0.39mm/px · z∈[-13,+142]mm · 4 of 38 slices shown (2 of 2)]
[im 1/38]
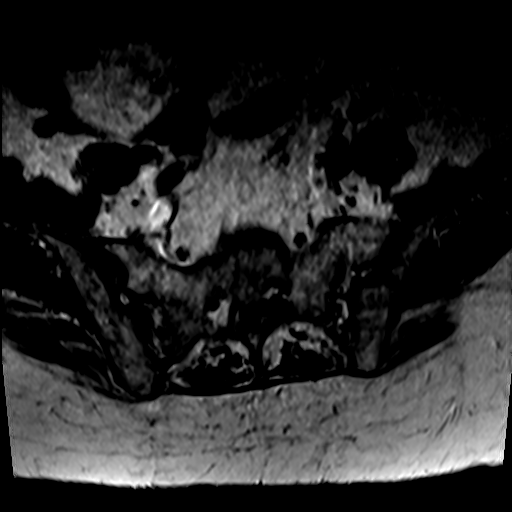
[im 6/38]
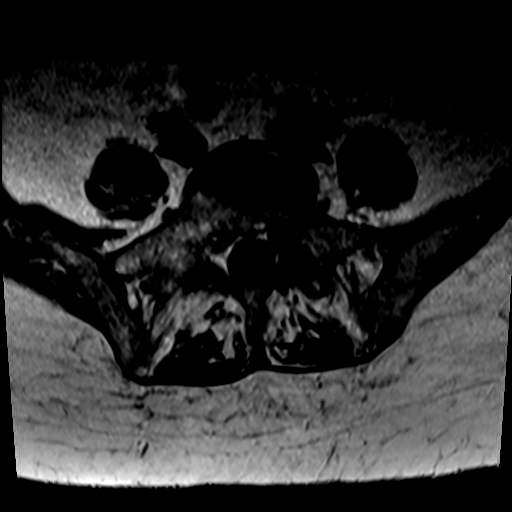
[im 19/38]
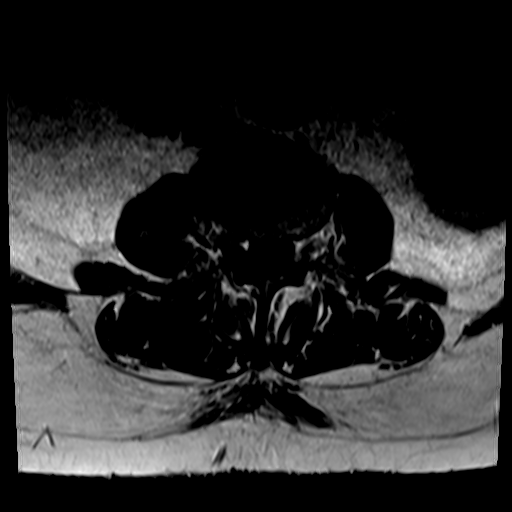
[im 32/38]
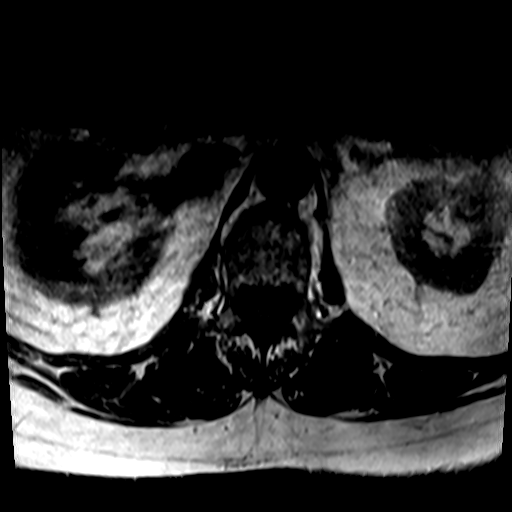

[25 of 48 positions shown; findings below may reference images not displayed]

FINDINGS: Despite efforts by the technologist and patient, motion artifact is
present on today's exam and could not be eliminated. This reduces
exam sensitivity and specificity.

Segmentation: The lowest lumbar type non-rib-bearing vertebra is
labeled as L5.

Alignment: Stable grade 1 degenerative retrolisthesis at L2-3, L3-4,
and L4-5. Subtle levoconvex lumbar scoliosis with rotary component.

Vertebrae: Note is again made of type 2 degenerative endplate
findings at L2-3 with disc desiccation throughout the lumbar spine
with loss of disc height L2-3.

Conus medullaris and cauda equina: Conus extends to the L1 level.
Conus and cauda equina appear normal.

Paraspinal and other soft tissues: Unremarkable

Disc levels:

L1-2: No impingement. Central disc protrusion.

L2-3: No impingement. Left paracentral and right inferior foraminal
disc protrusion superimposed on disc bulge.

L3-4: Mild right foraminal stenosis and mild displacement of both L3
nerves in the lateral extraforaminal space due to disc bulge, facet
arthropathy, and intervertebral spurring. Borderline bilateral
subarticular lateral recess stenosis. Similar appearance to prior.

L4-5: Borderline central narrowing of the thecal sac and borderline
bilateral foraminal stenosis due to disc bulge and facet
arthropathy, similar to the prior exam. Small synovial cysts
adjacent to the facet joints, not currently in a position to cause
impingement.

L5-S1: Moderate to prominent left and mild right foraminal stenosis
due to disc bulge, facet arthropathy, and left foraminal synovial
cyst, similar to the prior exam.
IMPRESSION: 1. Lumbar spondylosis and degenerative disc disease, causing
moderate to prominent impingement at L5-S1 and mild impingement at
L3-4, as detailed above. Overall findings are stable from prior. At
L5-S1, a synovial cyst also contributes to the left foraminal
impingement.

## 2022-11-30 ENCOUNTER — Other Ambulatory Visit: Payer: Self-pay | Admitting: *Deleted

## 2022-11-30 DIAGNOSIS — M79606 Pain in leg, unspecified: Secondary | ICD-10-CM

## 2022-12-13 NOTE — Progress Notes (Unsigned)
[facility] [place]  ASSESSMENT / PLAN: 82 y.o. female with bilateral lower extremity pain.  Her symptoms seem typical of spinal stenosis, with radiating discomfort, paresthesias, exacerbated by positional changes.  Her vascular physical exam and noninvasive testing are normal.  I encouraged her to follow-up with her primary care physician or spinal specialist for further evaluation.  She can follow-up with me in an as-needed basis  CHIEF COMPLAINT: Bilateral lower extremity pain  HISTORY OF PRESENT ILLNESS: Monica Kerr is a 82 y.o. female referred to clinic for evaluation of bilateral lower extremity pain.  The patient has fairly typical symptoms of spinal stenosis.  She has radiating discomfort from her back down the backside of her legs into her calves.  She has paresthesias in her lower extremities.  She has sensory changes in her anterior lower legs.  She is limited in her ability to walk by her back discomfort.  She does not have any typical vascular claudication symptoms.  She has no rest pain, she has no ulcers.  She has been treated for spinal stenosis in the past.  She has had a spinal stimulator inserted which did not relieve her spinal stenosis.  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   Hyperlipidemia    Hypothyroidism    Medial meniscus tear 07/2017   right   Pathological fracture of femur (HCC) 07/2017   right   Seasonal allergies     Past Surgical History:  Procedure Laterality Date   APPENDECTOMY     CATARACT EXTRACTION W/ INTRAOCULAR LENS  IMPLANT, BILATERAL     KNEE ARTHROSCOPY WITH MEDIAL MENISECTOMY Right 07/19/2017   Procedure: KNEE ARTHROSCOPY WITH MEDIAL MENISCECTOMY, CHONDROPLASTY, SUBCHONDROPLASTY;  Surgeon: Sheral Apley, MD;  Location: [facility];  Service: Orthopedics;  Laterality: Right;   PERCUTANEOUS  PINNING Right 07/19/2017   Procedure: PERCUTANEOUS SKELETAL FIXATION FEMORAL DISTAL END MEDIAL LATERAL SUPRACONDYLE TRANSCONDYLE;  Surgeon: Sheral Apley, MD;  Location: [facility];  Service: Orthopedics;  Laterality: Right;    No family history on file.  Social History   Socioeconomic History   Marital status: Legally Separated    Spouse name: Not on file   Number of children: Not on file   Years of education: Not on file   Highest education level: Not on file  Occupational History   Not on file  Tobacco Use   Smoking status: Never   Smokeless tobacco: Never  Vaping Use   Vaping status: Never Used  Substance and Sexual Activity   Alcohol use: Never   Drug use: Never   Sexual activity: Not on file  Other Topics Concern   Not on file  Social History Narrative   Not on file   Social Determinants of Health   Financial Resource Strain: Low Risk  (03/19/2022)   Received from [facility]   Overall Financial Resource Strain (CARDIA)    Difficulty of Paying Living Expenses: Not hard at all  Food Insecurity: No Food Insecurity (10/26/2021)   Received from [facility]   Hunger Vital Sign    Worried About Running Out of Food in the Last Year: Never true    Ran Out of Food in the Last Year: Never true  Transportation Needs: Not on file  Physical Activity: Unknown (09/08/2022)   Received from [facility]   Exercise Vital Sign  Days of Exercise per Week: 2 days    Minutes of Exercise per Session: Not on file  Stress: No Stress Concern Present (03/19/2022)   Received from [facility] - Occupational Stress Questionnaire    Feeling of Stress : Not at all  Social Connections: Unknown (08/12/2021)   Received from [facility]   Social Network    Social Network: Not on file  Intimate Partner Violence: Unknown (07/10/2021)   Received from [facility]   HITS    Physically Hurt: Not on file    Insult or Talk Down  To: Not on file    Threaten Physical Harm: Not on file    Scream or Curse: Not on file    No Known Allergies  Current Outpatient Medications  Medication Sig Dispense Refill   aspirin EC 81 MG tablet Take 1 tablet (81 mg total) by mouth daily. For 30 days for DVT prophylaxis. 30 tablet 0   baclofen (LIORESAL) 10 MG tablet Take 1 tablet (10 mg total) by mouth 3 (three) times daily as needed for muscle spasms. 40 each 0   calcium carbonate (CALCIUM 600) 600 MG TABS tablet Take 600 mg by mouth 2 (two) times daily with a meal.     citalopram (CELEXA) 20 MG tablet Take 20 mg by mouth.     docusate sodium (COLACE) 100 MG capsule Take 1 capsule (100 mg total) by mouth 2 (two) times daily. To prevent constipation while taking pain medication. 60 capsule 0   levothyroxine (SYNTHROID, LEVOTHROID) 25 MCG tablet TAKE 1 TABLET BY MOUTH DAILY     loratadine (CLARITIN) 10 MG tablet Take 10 mg by mouth daily.     Multiple Vitamin (MULTIVITAMIN) tablet Take 1 tablet by mouth daily.     nortriptyline (PAMELOR) 50 MG capsule Take 50 mg by mouth.     ondansetron (ZOFRAN ODT) 4 MG disintegrating tablet Take 1 tablet (4 mg total) by mouth every 8 (eight) hours as needed for up to 14 doses for nausea or vomiting. 14 tablet 0   ondansetron (ZOFRAN) 4 MG tablet Take 1 tablet (4 mg total) by mouth every 8 (eight) hours as needed for nausea or vomiting. 20 tablet 0   pantoprazole (PROTONIX) 40 MG tablet Take 1 tablet (40 mg total) by mouth daily. For gastroprotection when taking NSAID postoperatively. 30 tablet 0   rosuvastatin (CRESTOR) 20 MG tablet TAKE 1 TABLET BY MOUTH DAILY. GENERIC FOR CRESTOR     zolpidem (AMBIEN) 10 MG tablet TAKE 1 TABLET BY MOUTH EVERY NIGHT AT BEDTIME AS NEEDED FOR SLEEP     No current facility-administered medications for this visit.    PHYSICAL EXAM Vitals:   12/14/22 1130  BP: (!) 160/94  Pulse: 68  Resp: 16  Temp: 97.9 F (36.6 C)  SpO2: 91%  Weight: 182 lb (82.6 kg)  Height:  5' (1.524 m)    Elderly woman in no distress Regular rate and rhythm Unlabored breathing 2+ posterior tibial pulses bilaterally   PERTINENT LABORATORY AND RADIOLOGIC DATA  Most recent CBC    Latest Ref Rng & Units 08/04/2019    1:42 PM 08/04/2019   12:32 PM  CBC  WBC 4.0 - 10.5 K/uL  6.9   Hemoglobin 12.0 - 15.0 g/dL 29.5  62.1   Hematocrit 36.0 - 46.0 % 40.0  44.9   Platelets 150 - 400 K/uL  344      Most recent CMP    Latest  Ref Rng & Units 08/04/2019    1:42 PM  CMP  Glucose 70 - 99 mg/dL 696   BUN 8 - 23 mg/dL 18   Creatinine 2.95 - 1.00 mg/dL 2.84   Sodium 132 - 440 mmol/L 139   Potassium 3.5 - 5.1 mmol/L 4.1   Chloride 98 - 111 mmol/L 102      +-------+-----------+-----------+------------+------------+  ABI/TBIToday's ABIToday's TBIPrevious ABIPrevious TBI  +-------+-----------+-----------+------------+------------+  Right 1.20       0.86                                 +-------+-----------+-----------+------------+------------+  Left  1.20       0.79                                 +-------+-----------+-----------+------------+------------+    Rande Brunt. Lenell Antu, MD FACS [facility] Phone Number: [phone] 12/13/2022 8:28 PM   Total time spent on preparing this encounter including chart review, data review, collecting history, examining the patient, coordinating care for this new patient, 45 minutes.  Portions of this report may have been transcribed using voice recognition software.  Every effort has been made to ensure accuracy; however, inadvertent computerized transcription errors may still be present.

## 2022-12-14 ENCOUNTER — Ambulatory Visit (INDEPENDENT_AMBULATORY_CARE_PROVIDER_SITE_OTHER): Payer: Medicare Other | Admitting: Vascular Surgery

## 2022-12-14 ENCOUNTER — Ambulatory Visit (HOSPITAL_COMMUNITY)
Admission: RE | Admit: 2022-12-14 | Discharge: 2022-12-14 | Disposition: A | Discharge status: Home / Self Care | Payer: Medicare Other | Source: Ambulatory Visit | Attending: Vascular Surgery | Admitting: Vascular Surgery

## 2022-12-14 ENCOUNTER — Encounter: Payer: Self-pay | Admitting: Vascular Surgery

## 2022-12-14 VITALS — BP 160/94 | HR 68 | Temp 97.9°F | Resp 16 | Ht 60.0 in | Wt 182.0 lb

## 2022-12-14 DIAGNOSIS — M79605 Pain in left leg: Secondary | ICD-10-CM | POA: Diagnosis not present

## 2022-12-14 DIAGNOSIS — M79606 Pain in leg, unspecified: Secondary | ICD-10-CM | POA: Insufficient documentation

## 2022-12-14 DIAGNOSIS — M79604 Pain in right leg: Secondary | ICD-10-CM | POA: Diagnosis not present

## 2022-12-14 LAB — VAS US ABI WITH/WO TBI
Left ABI: 1.2
Right ABI: 1.2

## 2023-11-28 ENCOUNTER — Other Ambulatory Visit (HOSPITAL_BASED_OUTPATIENT_CLINIC_OR_DEPARTMENT_OTHER): Payer: Self-pay

## 2023-11-28 MED ORDER — ROSUVASTATIN CALCIUM 20 MG PO TABS
20.0000 mg | ORAL_TABLET | Freq: Every day | ORAL | 1 refills | Status: DC
  Filled 2023-11-28 – 2024-02-20 (×2): qty 90, 90d supply, fill #0

## 2023-11-28 MED ORDER — PREGABALIN 75 MG PO CAPS
75.0000 mg | ORAL_CAPSULE | Freq: Three times a day (TID) | ORAL | 3 refills | Status: DC
Start: 2023-11-28 — End: 2023-12-08
  Filled 2023-11-28: qty 90, 30d supply, fill #0
  Filled ????-??-??: fill #0

## 2023-11-28 MED ORDER — CITALOPRAM HYDROBROMIDE 20 MG PO TABS
20.0000 mg | ORAL_TABLET | Freq: Every day | ORAL | 1 refills | Status: AC
  Filled 2023-11-28 – 2024-02-20 (×2): qty 90, 90d supply, fill #0
  Filled 2024-05-08: qty 90, 90d supply, fill #1

## 2023-11-28 MED ORDER — OMEPRAZOLE 40 MG PO CPDR
40.0000 mg | DELAYED_RELEASE_CAPSULE | Freq: Every day | ORAL | 1 refills | Status: DC
  Filled 2024-03-20: qty 90, 90d supply, fill #0

## 2023-11-28 MED ORDER — LEVOTHYROXINE SODIUM 50 MCG PO TABS
50.0000 ug | ORAL_TABLET | Freq: Every morning | ORAL | 1 refills | Status: DC
  Filled 2024-03-20: qty 90, 90d supply, fill #0

## 2023-12-08 ENCOUNTER — Other Ambulatory Visit (HOSPITAL_BASED_OUTPATIENT_CLINIC_OR_DEPARTMENT_OTHER): Payer: Self-pay

## 2023-12-08 MED ORDER — PREGABALIN 100 MG PO CAPS
100.0000 mg | ORAL_CAPSULE | Freq: Three times a day (TID) | ORAL | 3 refills | Status: DC
  Filled 2023-12-08: qty 90, 30d supply, fill #0
  Filled 2024-01-24: qty 90, 30d supply, fill #1

## 2023-12-26 ENCOUNTER — Other Ambulatory Visit (HOSPITAL_BASED_OUTPATIENT_CLINIC_OR_DEPARTMENT_OTHER): Payer: Self-pay

## 2023-12-26 MED ORDER — ZOLPIDEM TARTRATE 10 MG PO TABS
5.0000 mg | ORAL_TABLET | Freq: Every evening | ORAL | 0 refills | Status: AC | PRN
  Filled 2023-12-26: qty 15, 30d supply, fill #0

## 2024-01-19 ENCOUNTER — Other Ambulatory Visit (HOSPITAL_BASED_OUTPATIENT_CLINIC_OR_DEPARTMENT_OTHER): Payer: Self-pay

## 2024-01-19 MED ORDER — ZOLPIDEM TARTRATE 10 MG PO TABS
5.0000 mg | ORAL_TABLET | Freq: Every evening | ORAL | 0 refills | Status: DC
  Filled 2024-01-23: qty 15, 30d supply, fill #0

## 2024-01-23 ENCOUNTER — Other Ambulatory Visit (HOSPITAL_BASED_OUTPATIENT_CLINIC_OR_DEPARTMENT_OTHER): Payer: Self-pay

## 2024-01-24 ENCOUNTER — Other Ambulatory Visit (HOSPITAL_BASED_OUTPATIENT_CLINIC_OR_DEPARTMENT_OTHER): Payer: Self-pay

## 2024-02-20 ENCOUNTER — Other Ambulatory Visit (HOSPITAL_BASED_OUTPATIENT_CLINIC_OR_DEPARTMENT_OTHER): Payer: Self-pay

## 2024-02-22 ENCOUNTER — Other Ambulatory Visit: Payer: Self-pay

## 2024-02-22 ENCOUNTER — Other Ambulatory Visit (HOSPITAL_BASED_OUTPATIENT_CLINIC_OR_DEPARTMENT_OTHER): Payer: Self-pay

## 2024-02-22 MED ORDER — ZOLPIDEM TARTRATE 10 MG PO TABS
5.0000 mg | ORAL_TABLET | Freq: Every evening | ORAL | 0 refills | Status: DC
  Filled 2024-02-22: qty 15, 30d supply, fill #0

## 2024-02-23 ENCOUNTER — Other Ambulatory Visit (HOSPITAL_BASED_OUTPATIENT_CLINIC_OR_DEPARTMENT_OTHER): Payer: Self-pay

## 2024-03-12 ENCOUNTER — Other Ambulatory Visit: Payer: Self-pay

## 2024-03-12 ENCOUNTER — Other Ambulatory Visit (HOSPITAL_BASED_OUTPATIENT_CLINIC_OR_DEPARTMENT_OTHER): Payer: Self-pay

## 2024-03-12 MED ORDER — DULOXETINE HCL 20 MG PO CPEP
20.0000 mg | ORAL_CAPSULE | Freq: Every day | ORAL | 3 refills | Status: AC
  Filled 2024-03-12: qty 30, 30d supply, fill #0
  Filled 2024-03-20 – 2024-04-04 (×2): qty 30, 30d supply, fill #1
  Filled 2024-05-04: qty 30, 30d supply, fill #2

## 2024-03-12 MED ORDER — PREGABALIN 75 MG PO CAPS
75.0000 mg | ORAL_CAPSULE | Freq: Three times a day (TID) | ORAL | 3 refills | Status: AC
  Filled 2024-03-12: qty 90, 30d supply, fill #0
  Filled 2024-05-08: qty 90, 30d supply, fill #1

## 2024-03-12 MED ORDER — MELOXICAM 7.5 MG PO TABS
7.5000 mg | ORAL_TABLET | Freq: Every day | ORAL | 3 refills | Status: AC
  Filled 2024-03-12: qty 30, 30d supply, fill #0
  Filled 2024-04-04: qty 30, 30d supply, fill #1
  Filled 2024-05-04: qty 30, 30d supply, fill #2

## 2024-03-20 ENCOUNTER — Other Ambulatory Visit (HOSPITAL_BASED_OUTPATIENT_CLINIC_OR_DEPARTMENT_OTHER): Payer: Self-pay

## 2024-03-20 MED ORDER — ZOLPIDEM TARTRATE 10 MG PO TABS
10.0000 mg | ORAL_TABLET | ORAL | 0 refills | Status: AC
  Filled 2024-03-20: qty 15, 30d supply, fill #0
  Filled 2024-03-23: qty 15, 15d supply, fill #0
  Filled ????-??-??: fill #0

## 2024-03-22 ENCOUNTER — Other Ambulatory Visit (HOSPITAL_BASED_OUTPATIENT_CLINIC_OR_DEPARTMENT_OTHER): Payer: Self-pay

## 2024-03-23 ENCOUNTER — Other Ambulatory Visit (HOSPITAL_BASED_OUTPATIENT_CLINIC_OR_DEPARTMENT_OTHER): Payer: Self-pay

## 2024-04-04 ENCOUNTER — Other Ambulatory Visit: Payer: Self-pay

## 2024-04-20 ENCOUNTER — Other Ambulatory Visit (HOSPITAL_BASED_OUTPATIENT_CLINIC_OR_DEPARTMENT_OTHER): Payer: Self-pay

## 2024-04-20 MED ORDER — ZOLPIDEM TARTRATE 10 MG PO TABS
5.0000 mg | ORAL_TABLET | Freq: Every evening | ORAL | 0 refills | Status: DC | PRN
  Filled 2024-04-20: qty 15, 30d supply, fill #0

## 2024-04-23 ENCOUNTER — Other Ambulatory Visit (HOSPITAL_BASED_OUTPATIENT_CLINIC_OR_DEPARTMENT_OTHER): Payer: Self-pay

## 2024-05-02 ENCOUNTER — Other Ambulatory Visit (HOSPITAL_BASED_OUTPATIENT_CLINIC_OR_DEPARTMENT_OTHER): Payer: Self-pay

## 2024-05-02 MED ORDER — FLUCONAZOLE 150 MG PO TABS
150.0000 mg | ORAL_TABLET | ORAL | 0 refills | Status: AC
  Filled 2024-05-02: qty 2, 6d supply, fill #0

## 2024-05-04 ENCOUNTER — Other Ambulatory Visit: Payer: Self-pay

## 2024-05-08 ENCOUNTER — Other Ambulatory Visit (HOSPITAL_BASED_OUTPATIENT_CLINIC_OR_DEPARTMENT_OTHER): Payer: Self-pay

## 2024-05-09 ENCOUNTER — Other Ambulatory Visit (HOSPITAL_BASED_OUTPATIENT_CLINIC_OR_DEPARTMENT_OTHER): Payer: Self-pay

## 2024-05-09 ENCOUNTER — Other Ambulatory Visit: Payer: Self-pay

## 2024-05-09 MED ORDER — ROSUVASTATIN CALCIUM 20 MG PO TABS
20.0000 mg | ORAL_TABLET | Freq: Every day | ORAL | 1 refills | Status: AC
  Filled 2024-05-09: qty 90, 90d supply, fill #0

## 2024-05-11 ENCOUNTER — Other Ambulatory Visit (HOSPITAL_BASED_OUTPATIENT_CLINIC_OR_DEPARTMENT_OTHER): Payer: Self-pay

## 2024-05-11 ENCOUNTER — Other Ambulatory Visit: Payer: Self-pay

## 2024-05-11 MED ORDER — TRIAMCINOLONE ACETONIDE 0.1 % EX CREA
1.0000 | TOPICAL_CREAM | Freq: Two times a day (BID) | CUTANEOUS | 0 refills | Status: AC
  Filled 2024-05-11: qty 75, 38d supply, fill #0

## 2024-05-11 MED ORDER — ROSUVASTATIN CALCIUM 20 MG PO TABS
20.0000 mg | ORAL_TABLET | Freq: Every day | ORAL | 1 refills | Status: AC
  Filled 2024-05-11: qty 90, 90d supply, fill #0

## 2024-05-11 MED ORDER — TERCONAZOLE 0.8 % VA CREA
1.0000 | TOPICAL_CREAM | VAGINAL | 1 refills | Status: AC
  Filled 2024-05-11: qty 20, 30d supply, fill #0

## 2024-05-11 MED ORDER — ZOLPIDEM TARTRATE ER 6.25 MG PO TBCR
6.2500 mg | EXTENDED_RELEASE_TABLET | Freq: Every evening | ORAL | 0 refills | Status: AC | PRN
  Filled 2024-05-11: qty 30, 30d supply, fill #0

## 2024-05-11 MED ORDER — MUPIROCIN 2 % EX OINT
1.0000 | TOPICAL_OINTMENT | Freq: Two times a day (BID) | CUTANEOUS | 0 refills | Status: AC
  Filled 2024-05-11: qty 22, 11d supply, fill #0

## 2024-05-11 MED ORDER — OMEPRAZOLE 40 MG PO CPDR
40.0000 mg | DELAYED_RELEASE_CAPSULE | Freq: Every day | ORAL | 1 refills | Status: AC
  Filled 2024-05-11: qty 90, 90d supply, fill #0

## 2024-05-11 MED ORDER — LEVOTHYROXINE SODIUM 50 MCG PO TABS
50.0000 ug | ORAL_TABLET | Freq: Every morning | ORAL | 1 refills | Status: AC
  Filled 2024-05-11: qty 90, 90d supply, fill #0
